# Patient Record
Sex: Female | Born: 1975 | Race: Black or African American | Hispanic: No | Marital: Married | Smoking: Never smoker
Health system: Southern US, Community
[De-identification: ages and names within clinical notes are randomized; demographics above are authoritative.]

## PROBLEM LIST (undated history)

## (undated) DIAGNOSIS — R51 Headache: Secondary | ICD-10-CM

## (undated) DIAGNOSIS — Z789 Other specified health status: Secondary | ICD-10-CM

## (undated) DIAGNOSIS — E669 Obesity, unspecified: Secondary | ICD-10-CM

## (undated) DIAGNOSIS — D573 Sickle-cell trait: Secondary | ICD-10-CM

## (undated) DIAGNOSIS — D352 Benign neoplasm of pituitary gland: Secondary | ICD-10-CM

## (undated) DIAGNOSIS — D496 Neoplasm of unspecified behavior of brain: Secondary | ICD-10-CM

## (undated) DIAGNOSIS — J189 Pneumonia, unspecified organism: Secondary | ICD-10-CM

## (undated) DIAGNOSIS — K219 Gastro-esophageal reflux disease without esophagitis: Secondary | ICD-10-CM

## (undated) HISTORY — DX: Other specified health status: Z78.9

## (undated) HISTORY — DX: Obesity, unspecified: E66.9

## (undated) HISTORY — PX: GALLBLADDER SURGERY: SHX652

## (undated) HISTORY — DX: Benign neoplasm of pituitary gland: D35.2

---

## 1997-04-07 ENCOUNTER — Other Ambulatory Visit: Admission: RE | Admit: 1997-04-07 | Discharge: 1997-04-07 | Payer: Self-pay | Admitting: Obstetrics

## 1997-06-03 ENCOUNTER — Ambulatory Visit (HOSPITAL_COMMUNITY): Admission: RE | Admit: 1997-06-03 | Discharge: 1997-06-03 | Payer: Self-pay | Admitting: Obstetrics

## 1997-07-02 ENCOUNTER — Inpatient Hospital Stay (HOSPITAL_COMMUNITY): Admission: AD | Admit: 1997-07-02 | Discharge: 1997-07-02 | Payer: Self-pay | Admitting: Obstetrics

## 1997-08-26 ENCOUNTER — Ambulatory Visit (HOSPITAL_COMMUNITY)
Admission: RE | Admit: 1997-08-26 | Discharge: 1997-08-26 | Payer: No Typology Code available for payment source | Admitting: Obstetrics

## 1997-10-22 ENCOUNTER — Inpatient Hospital Stay (HOSPITAL_COMMUNITY): Admission: AD | Admit: 1997-10-22 | Discharge: 1997-10-22 | Payer: Self-pay | Admitting: Obstetrics

## 1997-11-06 ENCOUNTER — Inpatient Hospital Stay (HOSPITAL_COMMUNITY): Admission: AD | Admit: 1997-11-06 | Discharge: 1997-11-08 | Payer: Self-pay | Admitting: Obstetrics

## 1999-02-07 ENCOUNTER — Inpatient Hospital Stay (HOSPITAL_COMMUNITY): Admission: AD | Admit: 1999-02-07 | Discharge: 1999-02-07 | Payer: Self-pay | Admitting: *Deleted

## 2001-01-19 ENCOUNTER — Emergency Department (HOSPITAL_COMMUNITY)
Admission: EM | Admit: 2001-01-19 | Discharge: 2001-01-19 | Payer: No Typology Code available for payment source | Admitting: Emergency Medicine

## 2002-07-23 ENCOUNTER — Emergency Department (HOSPITAL_COMMUNITY): Admission: EM | Admit: 2002-07-23 | Discharge: 2002-07-23 | Payer: Self-pay | Admitting: Emergency Medicine

## 2002-07-26 ENCOUNTER — Emergency Department (HOSPITAL_COMMUNITY): Admission: EM | Admit: 2002-07-26 | Discharge: 2002-07-26 | Payer: Self-pay | Admitting: Emergency Medicine

## 2005-01-15 DIAGNOSIS — J189 Pneumonia, unspecified organism: Secondary | ICD-10-CM

## 2005-01-15 HISTORY — DX: Pneumonia, unspecified organism: J18.9

## 2005-09-06 ENCOUNTER — Emergency Department (HOSPITAL_COMMUNITY): Admission: EM | Admit: 2005-09-06 | Discharge: 2005-09-06 | Payer: Self-pay | Admitting: Emergency Medicine

## 2007-05-22 ENCOUNTER — Encounter: Admission: RE | Admit: 2007-05-22 | Discharge: 2007-05-22 | Payer: Self-pay | Admitting: Endocrinology

## 2009-05-12 ENCOUNTER — Inpatient Hospital Stay (HOSPITAL_COMMUNITY): Admission: EM | Admit: 2009-05-12 | Discharge: 2009-05-13 | Payer: Self-pay | Admitting: Neurosurgery

## 2009-05-12 ENCOUNTER — Encounter: Payer: Self-pay | Admitting: Emergency Medicine

## 2009-08-04 HISTORY — PX: OTHER SURGICAL HISTORY: SHX169

## 2010-02-05 ENCOUNTER — Encounter: Payer: Self-pay | Admitting: Endocrinology

## 2010-04-04 LAB — URINE CULTURE

## 2010-04-04 LAB — POCT I-STAT, CHEM 8
Chloride: 104 mEq/L (ref 96–112)
Creatinine, Ser: 0.7 mg/dL (ref 0.4–1.2)
Glucose, Bld: 101 mg/dL — ABNORMAL HIGH (ref 70–99)
HCT: 31 % — ABNORMAL LOW (ref 36.0–46.0)
Hemoglobin: 10.5 g/dL — ABNORMAL LOW (ref 12.0–15.0)
Potassium: 3.5 mEq/L (ref 3.5–5.1)
Sodium: 142 mEq/L (ref 135–145)

## 2010-04-04 LAB — DIFFERENTIAL
Eosinophils Absolute: 0.2 10*3/uL (ref 0.0–0.7)
Lymphs Abs: 1.4 10*3/uL (ref 0.7–4.0)
Monocytes Relative: 7 % (ref 3–12)
Neutro Abs: 5.1 10*3/uL (ref 1.7–7.7)
Neutrophils Relative %: 71 % (ref 43–77)

## 2010-04-04 LAB — CORTISOL: Cortisol, Plasma: 17.1 ug/dL

## 2010-04-04 LAB — URINALYSIS, ROUTINE W REFLEX MICROSCOPIC
Glucose, UA: NEGATIVE mg/dL
Hgb urine dipstick: NEGATIVE
Nitrite: NEGATIVE
Specific Gravity, Urine: 1.013 (ref 1.005–1.030)
Urobilinogen, UA: 1 mg/dL (ref 0.0–1.0)
pH: 8 (ref 5.0–8.0)

## 2010-04-04 LAB — CBC
HCT: 31 % — ABNORMAL LOW (ref 36.0–46.0)
MCV: 73.9 fL — ABNORMAL LOW (ref 78.0–100.0)
Platelets: 304 10*3/uL (ref 150–400)
RBC: 4.19 MIL/uL (ref 3.87–5.11)

## 2010-04-04 LAB — FOLLICLE STIMULATING HORMONE: FSH: 1.4 m[IU]/mL

## 2010-04-04 LAB — URINE MICROSCOPIC-ADD ON

## 2010-04-04 LAB — PROLACTIN: Prolactin: 582.5 ng/mL — ABNORMAL HIGH

## 2010-04-04 LAB — LUTEINIZING HORMONE: LH: 0.3 m[IU]/mL

## 2011-04-04 ENCOUNTER — Encounter (HOSPITAL_COMMUNITY): Payer: Self-pay | Admitting: Emergency Medicine

## 2011-04-04 ENCOUNTER — Emergency Department (HOSPITAL_COMMUNITY): Payer: No Typology Code available for payment source

## 2011-04-04 ENCOUNTER — Emergency Department (HOSPITAL_COMMUNITY)
Admission: EM | Admit: 2011-04-04 | Discharge: 2011-04-04 | Disposition: A | Discharge status: Home / Self Care | Payer: No Typology Code available for payment source | Attending: Emergency Medicine | Admitting: Emergency Medicine

## 2011-04-04 DIAGNOSIS — R071 Chest pain on breathing: Secondary | ICD-10-CM | POA: Insufficient documentation

## 2011-04-04 DIAGNOSIS — D496 Neoplasm of unspecified behavior of brain: Secondary | ICD-10-CM | POA: Insufficient documentation

## 2011-04-04 DIAGNOSIS — S139XXA Sprain of joints and ligaments of unspecified parts of neck, initial encounter: Secondary | ICD-10-CM | POA: Insufficient documentation

## 2011-04-04 DIAGNOSIS — S161XXA Strain of muscle, fascia and tendon at neck level, initial encounter: Secondary | ICD-10-CM

## 2011-04-04 DIAGNOSIS — M549 Dorsalgia, unspecified: Secondary | ICD-10-CM | POA: Insufficient documentation

## 2011-04-04 DIAGNOSIS — R259 Unspecified abnormal involuntary movements: Secondary | ICD-10-CM | POA: Insufficient documentation

## 2011-04-04 HISTORY — DX: Neoplasm of unspecified behavior of brain: D49.6

## 2011-04-04 MED ORDER — KETOROLAC TROMETHAMINE 60 MG/2ML IM SOLN
60.0000 mg | Freq: Once | INTRAMUSCULAR | Status: AC
  Administered 2011-04-04: 60 mg via INTRAMUSCULAR
  Filled 2011-04-04: qty 2

## 2011-04-04 MED ORDER — DIAZEPAM 5 MG PO TABS
5.0000 mg | ORAL_TABLET | Freq: Once | ORAL | Status: AC
  Administered 2011-04-04: 5 mg via ORAL
  Filled 2011-04-04: qty 1

## 2011-04-04 MED ORDER — IBUPROFEN 800 MG PO TABS: 800.0000 mg | ORAL_TABLET | Freq: Three times a day (TID) | ORAL | Status: AC

## 2011-04-04 MED ORDER — DIAZEPAM 5 MG PO TABS: 5.0000 mg | ORAL_TABLET | Freq: Two times a day (BID) | ORAL | Status: AC

## 2011-04-04 NOTE — ED Notes (Signed)
Returned from xray
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
## 2011-04-04 NOTE — Discharge Instructions (Signed)
Take ibuprofen, up to 800mg  three times a day, as needed for pain. Apply heat to spasming muscles.  Avoid activities that aggravate pain.  You may return to the ER if your pain worsens or you have any other concerns, particularly if you develop difficulty breathingCervical Sprain A cervical sprain is an injury in the neck in which the ligaments are stretched or torn. The ligaments are the tissues that hold the bones of the neck (vertebrae) in place.Cervical sprains can range from very mild to very severe. Most cervical sprains get better in 1 to 3 weeks, but it depends on the cause and extent of the injury. Severe cervical sprains can cause the neck vertebrae to be unstable. This can lead to damage of the spinal cord and can result in serious nervous system problems. Your caregiver will determine whether your cervical sprain is mild or severe. CAUSES  Severe cervical sprains may be caused by:  Contact sport injuries (football, rugby, wrestling, hockey, auto racing, gymnastics, diving, martial arts, boxing).   Motor vehicle collisions.   Whiplash injuries. This means the neck is forcefully whipped backward and forward.   Falls.  Mild cervical sprains may be caused by:   Awkward positions, such as cradling a telephone between your ear and shoulder.   Sitting in a chair that does not offer proper support.   Working at a poorly Marketing executive station.   Activities that require looking up or down for long periods of time.  SYMPTOMS   Pain, soreness, stiffness, or a burning sensation in the front, back, or sides of the neck. This discomfort may develop immediately after injury or it may develop slowly and not begin for 24 hours or more after an injury.   Pain or tenderness directly in the middle of the back of the neck.   Shoulder or upper back pain.   Limited ability to move the neck.   Headache.   Dizziness.   Weakness, numbness, or tingling in the hands or arms.   Muscle spasms.    Difficulty swallowing or chewing.   Tenderness and swelling of the neck.  DIAGNOSIS  Most of the time, your caregiver can diagnose this problem by taking your history and doing a physical exam. Your caregiver will ask about any known problems, such as arthritis in the neck or a previous neck injury. X-rays may be taken to find out if there are any other problems, such as problems with the bones of the neck. However, an X-ray often does not reveal the full extent of a cervical sprain. Other tests such as a computed tomography (CT) scan or magnetic resonance imaging (MRI) may be needed. TREATMENT  Treatment depends on the severity of the cervical sprain. Mild sprains can be treated with rest, keeping the neck in place (immobilization), and pain medicines. Severe cervical sprains need immediate immobilization and an appointment with an orthopedist or neurosurgeon. Several treatment options are available to help with pain, muscle spasms, and other symptoms. Your caregiver may prescribe:  Medicines, such as pain relievers, numbing medicines, or muscle relaxants.   Physical therapy. This can include stretching exercises, strengthening exercises, and posture training. Exercises and improved posture can help stabilize the neck, strengthen muscles, and help stop symptoms from returning.   A neck collar to be worn for short periods of time. Often, these collars are worn for comfort. However, certain collars may be worn to protect the neck and prevent further worsening of a serious cervical sprain.  HOME CARE INSTRUCTIONS  Put ice on the injured area.   Put ice in a plastic bag.   Place a towel between your skin and the bag.   Leave the ice on for 15 to 20 minutes, 3 to 4 times a day.   Only take over-the-counter or prescription medicines for pain, discomfort, or fever as directed by your caregiver.   Keep all follow-up appointments as directed by your caregiver.   Keep all physical therapy  appointments as directed by your caregiver.   If a neck collar is prescribed, wear it as directed by your caregiver.   Do not drive while wearing a neck collar.   Make any needed adjustments to your work station to promote good posture.   Avoid positions and activities that make your symptoms worse.   Warm up and stretch before being active to help prevent problems.  SEEK MEDICAL CARE IF:   Your pain is not controlled with medicine.   You are unable to decrease your pain medicine over time as planned.   Your activity level is not improving as expected.  SEEK IMMEDIATE MEDICAL CARE IF:   You develop any bleeding, stomach upset, or signs of an allergic reaction to your medicine.   Your symptoms get worse.   You develop new, unexplained symptoms.   You have numbness, tingling, weakness, or paralysis in any part of your body.  MAKE SURE YOU:   Understand these instructions.   Will watch your condition.   Will get help right away if you are not doing well or get worse.  Document Released: 10/29/2006 Document Revised: 12/21/2010 Document Reviewed: 10/04/2010 [facility] Patient Information 2012 [place], [place].Marland Kitchen
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
## 2011-04-04 NOTE — ED Notes (Signed)
Patient transported to X-ray 

## 2012-03-14 ENCOUNTER — Encounter: Payer: Self-pay | Admitting: Nurse Practitioner

## 2012-04-10 ENCOUNTER — Ambulatory Visit (INDEPENDENT_AMBULATORY_CARE_PROVIDER_SITE_OTHER): Payer: Medicaid Other | Admitting: Nurse Practitioner

## 2012-04-10 ENCOUNTER — Encounter: Payer: Self-pay | Admitting: Nurse Practitioner

## 2012-04-10 VITALS — BP 120/75 | HR 73 | Ht 67.0 in | Wt 184.0 lb

## 2012-04-10 DIAGNOSIS — D352 Benign neoplasm of pituitary gland: Secondary | ICD-10-CM

## 2012-04-10 DIAGNOSIS — D353 Benign neoplasm of craniopharyngeal duct: Secondary | ICD-10-CM

## 2012-04-10 MED ORDER — CABERGOLINE 0.5 MG PO TABS: 1.0000 mg | ORAL_TABLET | ORAL | Status: DC

## 2012-04-10 NOTE — Patient Instructions (Addendum)
Patient will get prolactin level done today and I will call the results If she does not hear from me in several days call the office Continue Cabergoline as directed, prescription called to pharmacy Call for any increased headache or visual changes  Will repeat MRI of the brain and pituitary in 2015, next visit Followup in 6 months

## 2012-04-10 NOTE — Progress Notes (Signed)
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 Her last prolactin level in July 2013 was elevated at 119.5 that had previously been 238.3. Patient is on Cabergoline 0.5 mg 2 tablets twice weekly. She denies any headache or visual field change at since last seen. She had prior surgical resection of the macroadenoma. Last MRI of the brain showed decrease in mass size of the pituitary 2/7/ 2013. 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  ROS:  Negative  Physical Exam General: well developed, well nourished, seated, in no evident distress Head: head normocephalic and atraumatic. Oropharynx benign Neck: supple with no carotid or supraclavicular bruits Cardiovascular: regular rate and rhythm, no murmurs  Neurologic Exam Mental Status: Awake and fully alert. Oriented to place and time. Recent and remote memory intact. Attention span, concentration and fund of knowledge appropriate. Mood and affect appropriate.  Cranial Nerves: Fundoscopic exam reveals flat disc margins. Pupils equal, briskly reactive to light. Extraocular movements full without nystagmus. Visual fields full to confrontation. Hearing intact and symmetric to finger snap. Facial sensation intact. Face, tongue, palate move normally and symmetrically. Neck flexion and extension normal.  Motor: Normal bulk and tone. Normal strength in all tested extremity muscles. Sensory.: intact to touch and pinprick and vibratory.  Coordination: Rapid alternating movements normal in all extremities. Finger-to-nose and heel-to-shin performed accurately bilaterally. Gait and Station: Arises from chair without difficulty. Stance is normal. Gait demonstrates normal stride length and balance . Able to heel, toe and tandem walk without difficulty.  Reflexes: 1+ and symmetric. Toes downgoing.     ASSESSMENT: Pituitary macroadenoma, prolactin secreting.     PLAN: Patient to continue  Cabergoline 0.5 mg 2 tabs twice weekly. Prescription renewed  Will repeat MRI of the brain  and pituitary February 2015.  Will get prolactin level drawn today.  Nilda Riggs, GNP-BC APRN

## 2012-04-10 NOTE — Progress Notes (Signed)
I have reviewed the note, and I agree with the medical plan. Marlan Palau MD 04/10/2012 5:35 PM  [facility] [address] [place], [place] [address]  Phone [phone] Fax [phone]

## 2012-04-11 LAB — PROLACTIN: Prolactin: 120.6 ng/mL — ABNORMAL HIGH (ref 4.8–23.3)

## 2012-04-14 ENCOUNTER — Telehealth: Payer: Self-pay | Admitting: *Deleted

## 2012-04-14 NOTE — Telephone Encounter (Signed)
"  Please return my call." 

## 2012-04-17 NOTE — Telephone Encounter (Signed)
Called patient and she wanted to know what her levels were from her labs. Patient wants results mailed to her from this time and last time so she can compare.

## 2012-04-17 NOTE — Telephone Encounter (Signed)
Mailing labs to patient. Patient wants to know if she could start taking vitamins. The vitiamin she wants to take is biotin and she wants to know if she could take them along with her medicines she is already on. Told patient taking a vitamin is fine but she wants to make sure form CM.

## 2012-04-17 NOTE — Telephone Encounter (Signed)
Patient aware.

## 2012-04-17 NOTE — Telephone Encounter (Signed)
OK to take Biotin.

## 2012-06-23 ENCOUNTER — Telehealth: Payer: Self-pay | Admitting: Neurology

## 2012-07-02 NOTE — Telephone Encounter (Signed)
I discussed with Dr. Anne Hahn, this medication is not classified as a steroid. She will need to stay on this as long as her prolactin levels are elevated. It is the only drug indicated for this.

## 2012-07-02 NOTE — Telephone Encounter (Signed)
Pt to have surgery (breast reduction) and doctor wants her to lose weight and she is on this medication cabergoline 0.5mg  which causes her to gain weight.  Can she come off?   I LMVM for pt to return call if anything more.  Original message not routed to triage.

## 2012-07-02 NOTE — Telephone Encounter (Signed)
I called pt and let her know via VM of below message about cabergoline.  She is to call back if questions.

## 2012-07-03 NOTE — Telephone Encounter (Signed)
I spoke to pt this am and  I relayed to her that the medication cabergoline is not a steroid, repeated as per below.  As per Enid Skeens, NP this does not cause weight gain.  Pt would like copy sent to Dr. Shon Hough (ofv note and note re: this medication) and also one for herself.  She will p/u today and sign release.

## 2012-10-10 ENCOUNTER — Encounter: Payer: Self-pay | Admitting: Nurse Practitioner

## 2012-10-10 ENCOUNTER — Ambulatory Visit (INDEPENDENT_AMBULATORY_CARE_PROVIDER_SITE_OTHER): Payer: Medicaid Other | Admitting: Nurse Practitioner

## 2012-10-10 VITALS — BP 113/77 | HR 85 | Ht 67.0 in | Wt 180.0 lb

## 2012-10-10 DIAGNOSIS — D352 Benign neoplasm of pituitary gland: Secondary | ICD-10-CM

## 2012-10-10 MED ORDER — CABERGOLINE 0.5 MG PO TABS: 1.0000 mg | ORAL_TABLET | ORAL | Status: DC

## 2012-10-10 NOTE — Progress Notes (Signed)
[facility]  PATIENT: Kari Coffey DOB: 24-Mar-1975   REASON FOR VISIT: Followup for pituitary macroadenoma   HISTORY OF PRESENT ILLNESS: Kari Coffey, 37 -year-old black female returns for followup. She has a history  of pituitary adenoma that is prolactin secreting. Her last prolactin level in 04/10/12  was elevated at 120.6.,  previously been 238.3. Patient is on Cabergoline 0.5 mg 2 tablets twice weekly. She denies any headache or visual field changes  since last seen. She had prior surgical resection of the macroadenoma. Last MRI of the brain showed decrease in mass size of the pituitary 2/7/ 2013. 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 She is exercising and eating healthy and states she feels better than  she has a long time    REVIEW OF SYSTEMS: Full 14 system review of systems performed and notable only for:  Constitutional: N/A  Cardiovascular: N/A  Ear/Nose/Throat: N/A  Skin: N/A  Eyes: N/A  Respiratory: N/A  Gastroitestinal: N/A  Hematology/Lymphatic: N/A  Endocrine: N/A Musculoskeletal:N/A  Allergy/Immunology: N/A  Neurological: N/A Psychiatric: N/A   ALLERGIES: No Known Allergies  HOME MEDICATIONS: Outpatient Prescriptions Prior to Visit  Medication Sig Dispense Refill  . cabergoline (DOSTINEX) 0.5 MG tablet Take 2 tablets (1 mg total) by mouth 2 (two) times a week. Mondays and Thursdays  16 tablet  6   No facility-administered medications prior to visit.    PAST MEDICAL HISTORY: Past Medical History  Diagnosis Date  . Brain tumor   . Pituitary macroadenoma   . Obesity     PAST SURGICAL HISTORY: History reviewed. No pertinent past surgical history.  FAMILY HISTORY: Family History  Problem Relation Age of Onset  . Healthy Father   . Healthy Sister   . Healthy Brother   . Healthy Sister   . Healthy Brother   . Healthy Brother   . Healthy Brother     SOCIAL HISTORY: History   Social History  .  Marital Status: Single    Spouse Name: N/A    Number of Children: N/A  . Years of Education: College    Occupational History  . Not on file.   Social History Main Topics  . Smoking status: Never Smoker   . Smokeless tobacco: Never Used  . Alcohol Use: No  . Drug Use: No  . Sexual Activity: Not on file   Other Topics Concern  . Not on file   Social History Narrative  . No narrative on file     PHYSICAL EXAM  Filed Vitals:   10/10/12 0929  BP: 113/77  Pulse: 85  Height: 5\' 7"  (1.702 m)  Weight: 180 lb (81.647 kg)   Body mass index is 28.19 kg/(m^2).  Generalized: Well developed, in no acute distress  Head: normocephalic and atraumatic,. Oropharynx benign  Neck: Supple, no carotid bruits  Cardiac: Regular rate rhythm, no murmur  Musculoskeletal: No deformity   Neurological examination   Mentation: Alert oriented to time, place, history taking. Follows all commands speech and language fluent  Cranial nerve II-XII: Fundoscopic exam reveals flat disc margins.visual acuity 20/40 right, 20/100 left. Pupils were equal round reactive to light extraocular movements were full, visual field were full on confrontational test. Facial sensation and strength were normal. hearing was intact to finger rubbing bilaterally. Uvula tongue midline. head turning and shoulder shrug and were normal and symmetric.Tongue protrusion into cheek strength was normal. Motor: normal bulk and tone, full strength in the BUE, BLE, fine finger movements normal,  no pronator drift. No focal weakness Sensory: normal and symmetric to light touch, pinprick, and  vibration  Coordination: finger-nose-finger, heel-to-shin bilaterally, no dysmetria Reflexes: 1+ and symmetric upper and lower Gait and Station: Rising up from seated position without assistance, normal stance,  moderate stride, good arm swing, smooth turning, able to perform tiptoe, and heel walking without difficulty.   DIAGNOSTIC DATA (LABS,  IMAGING, TESTING) -Last Prolactin level was 120.6    ASSESSMENT AND PLAN  37 y.o. year old female  has a past medical history of Brain tumor; Pituitary macroadenoma; and Obesity here for followup. She denies any headaches or visual symptoms  Will get a prolactin level today Will repeat MRI of the brain and pituitary in 2015, February please call and schedule prior to your appointment in March Continue Cabergoline at current dose will renew.  Followup in 6 months  Nilda Riggs, [facility], [facility], APRN  [facility] [address] [place], [place] [address] [phone]

## 2012-10-10 NOTE — Patient Instructions (Addendum)
Will get a prolactin level today Will repeat MRI of the brain and pituitary in 2015, February please call and schedule prior to your appointment in March Continue Cabergoline at current dose.  Followup in 6 months

## 2012-10-11 LAB — PROLACTIN: Prolactin: 87.6 ng/mL — ABNORMAL HIGH (ref 4.8–23.3)

## 2012-10-15 NOTE — Progress Notes (Signed)
Quick Note:  I called pt and relayed the results of prolactin level to her. She verbalized understanding. Released to mychart. ______

## 2012-11-19 ENCOUNTER — Other Ambulatory Visit (HOSPITAL_COMMUNITY): Payer: Self-pay | Admitting: Obstetrics

## 2012-11-19 DIAGNOSIS — Z1231 Encounter for screening mammogram for malignant neoplasm of breast: Secondary | ICD-10-CM

## 2012-11-20 ENCOUNTER — Ambulatory Visit (HOSPITAL_COMMUNITY)
Admission: RE | Admit: 2012-11-20 | Discharge: 2012-11-20 | Disposition: A | Discharge status: Home / Self Care | Payer: Medicaid Other | Source: Ambulatory Visit | Attending: Obstetrics | Admitting: Obstetrics

## 2012-11-20 ENCOUNTER — Other Ambulatory Visit: Payer: Self-pay

## 2012-11-20 DIAGNOSIS — Z1231 Encounter for screening mammogram for malignant neoplasm of breast: Secondary | ICD-10-CM | POA: Insufficient documentation

## 2012-12-04 ENCOUNTER — Telehealth: Payer: Self-pay | Admitting: Neurology

## 2012-12-04 MED ORDER — CABERGOLINE 0.5 MG PO TABS: 1.0000 mg | ORAL_TABLET | ORAL | Status: DC

## 2012-12-04 NOTE — Telephone Encounter (Signed)
Pharmacy indicates Rx must be sent under MD, not NP.  Rx was resent under MD.

## 2013-02-09 ENCOUNTER — Encounter: Payer: Self-pay | Admitting: Nurse Practitioner

## 2013-02-09 ENCOUNTER — Ambulatory Visit (INDEPENDENT_AMBULATORY_CARE_PROVIDER_SITE_OTHER): Payer: Medicaid Other | Admitting: Nurse Practitioner

## 2013-02-09 VITALS — BP 125/84 | HR 75 | Ht 66.0 in | Wt 182.0 lb

## 2013-02-09 DIAGNOSIS — Z79899 Other long term (current) drug therapy: Secondary | ICD-10-CM

## 2013-02-09 DIAGNOSIS — D352 Benign neoplasm of pituitary gland: Secondary | ICD-10-CM

## 2013-02-09 DIAGNOSIS — D353 Benign neoplasm of craniopharyngeal duct: Secondary | ICD-10-CM

## 2013-02-09 NOTE — Patient Instructions (Signed)
Will check prolactin level today Continue Cabergoline as ordered MRI of the brain with and without contrast ordered Followup in 6 months

## 2013-02-09 NOTE — Progress Notes (Signed)
[facility]  PATIENT: Kari Coffey DOB: December 28, 1975   REASON FOR VISIT: follow up pitutary macroadenoma    HISTORY OF PRESENT ILLNESS:[name], 39 year old black female returns for follow up. 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 Her last prolactin level 10/10/12 was 87.6. She remains on Cabergoline twice weekly. Last MRI of the brain was February 2013, she is due for repeat. 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 Her last prolactin level in 04/10/12 was elevated at 120.6., previously been 238.3. Patient is on Cabergoline 0.5 mg 2 tablets twice weekly. She denies any headache or visual field changes since last seen. She had prior surgical resection of the macroadenoma. Last MRI of the brain showed decrease in mass size of the pituitary 2/7/ 2013. 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 She is exercising and eating healthy and states she feels better than she has a long time      REVIEW OF SYSTEMS: Full 14 system review of systems performed and notable only for those listed, all others are neg:  Constitutional: N/A  Cardiovascular: N/A  Ear/Nose/Throat: N/A  Skin: N/A  Eyes: N/A  Respiratory: N/A  Gastroitestinal: N/A  Hematology/Lymphatic: N/A  Endocrine: N/A Musculoskeletal:N/A  Allergy/Immunology: N/A  Neurological: N/A Psychiatric: N/A   ALLERGIES: No Known Allergies  HOME MEDICATIONS: Outpatient Prescriptions Prior to Visit  Medication Sig Dispense Refill  . cabergoline (DOSTINEX) 0.5 MG tablet Take 2 tablets (1 mg total) by mouth 2 (two) times a week. Mondays and Thursdays  16 tablet  6   No facility-administered medications prior to visit.    PAST MEDICAL HISTORY: Past Medical History  Diagnosis Date  . Brain tumor   . Pituitary macroadenoma   . Obesity     PAST SURGICAL HISTORY: History reviewed. No pertinent  past surgical history.  FAMILY HISTORY: Family History  Problem Relation Age of Onset  . Healthy Father   . Healthy Sister   . Healthy Brother   . Healthy Sister   . Healthy Brother   . Healthy Brother   . Healthy Brother     SOCIAL HISTORY: History   Social History  . Marital Status: Single    Spouse Name: N/A    Number of Children: 1  . Years of Education: 12+   Occupational History  . Not on file.   Social History Main Topics  . Smoking status: Never Smoker   . Smokeless tobacco: Never Used  . Alcohol Use: No  . Drug Use: No  . Sexual Activity: Not on file   Other Topics Concern  . Not on file   Social History Narrative   Patient lives at home with son.   Patient has one child.   Patient is right handed.   Patient has her BA.    Patient is single.     PHYSICAL EXAM  Filed Vitals:   02/09/13 1333  BP: 125/84  Pulse: 75  Height: 5\' 6"  (1.676 m)  Weight: 182 lb (82.555 kg)   Body mass index is 29.39 kg/(m^2).  Generalized: Well developed, in no acute distress  Head: normocephalic and atraumatic,. Oropharynx benign  Neck: Supple, no carotid bruits  Cardiac: Regular rate rhythm, no murmur  Musculoskeletal: No deformity   Neurological examination   Mentation: Alert oriented to time, place, history taking. Follows all commands speech and language fluent  Cranial nerve  II-XII: Fundoscopic exam reveals sharp disc margins.Visual acuity 20/20 bilaterally. Pupils were equal round reactive to light extraocular movements were full, visual field were full on confrontational test. Facial sensation and strength were normal. hearing was intact to finger rubbing bilaterally. Uvula tongue midline. head turning and shoulder shrug were normal and symmetric.Tongue protrusion into cheek strength was normal. Motor: normal bulk and tone, full strength in the BUE, BLE, fine finger movements normal, no pronator drift. No focal weakness Sensory: normal and symmetric to light  touch, pinprick, and  vibration  Coordination: finger-nose-finger, heel-to-shin bilaterally, no dysmetria Reflexes: 1+ upper lower and symmetric  Gait and Station: Rising up from seated position without assistance, normal stance,  moderate stride, good arm swing, smooth turning, able to perform tiptoe, and heel walking without difficulty. Tandem gait is steady  DIAGNOSTIC DATA (LABS, IMAGING, TESTING) - None to review  ASSESSMENT AND PLAN  38 y.o. year old female  has a past medical history of Brain tumor; Pituitary macroadenoma; and Obesity. here to follow up  Will check prolactin level today Continue Cabergoline as ordered MRI of the brain with and without contrast ordered Followup in 6 months Dennie Bible, [facility], [facility], [facility] [address], [place], [place] [address] [phone]

## 2013-02-09 NOTE — Progress Notes (Signed)
I have read the note, and I agree with the clinical assessment and plan.  Niharika Savino KEITH   

## 2013-02-10 LAB — BASIC METABOLIC PANEL
BUN / CREAT RATIO: 11 (ref 8–20)
BUN: 8 mg/dL (ref 6–20)
CALCIUM: 9.5 mg/dL (ref 8.7–10.2)
CO2: 25 mmol/L (ref 18–29)
CREATININE: 0.76 mg/dL (ref 0.57–1.00)
Chloride: 102 mmol/L (ref 97–108)
GFR, EST AFRICAN AMERICAN: 118 mL/min/{1.73_m2} (ref >59)
GFR, EST NON AFRICAN AMERICAN: 102 mL/min/{1.73_m2} (ref >59)
Glucose: 82 mg/dL (ref 65–99)
POTASSIUM: 4 mmol/L (ref 3.5–5.2)
SODIUM: 144 mmol/L (ref 134–144)

## 2013-02-10 LAB — PROLACTIN: PROLACTIN: 103.7 ng/mL — AB (ref 4.8–23.3)

## 2013-02-11 NOTE — Progress Notes (Signed)
Quick Note:  Left message that prolactin level is elevated and to continue medication, per Hoyle Sauer ______

## 2013-04-01 ENCOUNTER — Ambulatory Visit
Admission: RE | Admit: 2013-04-01 | Discharge: 2013-04-01 | Disposition: A | Discharge status: Home / Self Care | Payer: Medicaid Other | Source: Ambulatory Visit | Attending: Nurse Practitioner | Admitting: Nurse Practitioner

## 2013-04-01 DIAGNOSIS — D352 Benign neoplasm of pituitary gland: Secondary | ICD-10-CM

## 2013-04-01 MED ORDER — GADOBENATE DIMEGLUMINE 529 MG/ML IV SOLN
8.0000 mL | Freq: Once | INTRAVENOUS | Status: AC | PRN
  Administered 2013-04-01: 8 mL via INTRAVENOUS

## 2013-04-14 ENCOUNTER — Ambulatory Visit: Payer: Medicaid Other | Admitting: Nurse Practitioner

## 2013-04-20 ENCOUNTER — Ambulatory Visit: Payer: Medicaid Other | Admitting: Nurse Practitioner

## 2013-08-04 ENCOUNTER — Encounter (HOSPITAL_COMMUNITY): Payer: Self-pay | Admitting: Emergency Medicine

## 2013-08-04 ENCOUNTER — Emergency Department (HOSPITAL_COMMUNITY)
Admission: EM | Admit: 2013-08-04 | Discharge: 2013-08-04 | Disposition: A | Discharge status: Other Acute Inpatient Hospital | Payer: Medicaid Other | Source: Home / Self Care | Attending: Family Medicine | Admitting: Family Medicine

## 2013-08-04 ENCOUNTER — Emergency Department (HOSPITAL_COMMUNITY)
Admission: EM | Admit: 2013-08-04 | Discharge: 2013-08-04 | Disposition: A | Discharge status: Home / Self Care | Payer: Medicaid Other | Attending: Emergency Medicine | Admitting: Emergency Medicine

## 2013-08-04 ENCOUNTER — Emergency Department (HOSPITAL_COMMUNITY): Payer: Medicaid Other

## 2013-08-04 DIAGNOSIS — Z86011 Personal history of benign neoplasm of the brain: Secondary | ICD-10-CM | POA: Diagnosis not present

## 2013-08-04 DIAGNOSIS — Z862 Personal history of diseases of the blood and blood-forming organs and certain disorders involving the immune mechanism: Secondary | ICD-10-CM | POA: Diagnosis not present

## 2013-08-04 DIAGNOSIS — R52 Pain, unspecified: Secondary | ICD-10-CM

## 2013-08-04 DIAGNOSIS — R112 Nausea with vomiting, unspecified: Secondary | ICD-10-CM | POA: Insufficient documentation

## 2013-08-04 DIAGNOSIS — K8010 Calculus of gallbladder with chronic cholecystitis without obstruction: Secondary | ICD-10-CM | POA: Diagnosis not present

## 2013-08-04 DIAGNOSIS — R1013 Epigastric pain: Secondary | ICD-10-CM | POA: Diagnosis present

## 2013-08-04 DIAGNOSIS — E669 Obesity, unspecified: Secondary | ICD-10-CM | POA: Diagnosis not present

## 2013-08-04 DIAGNOSIS — Z8639 Personal history of other endocrine, nutritional and metabolic disease: Secondary | ICD-10-CM | POA: Insufficient documentation

## 2013-08-04 LAB — CBC WITH DIFFERENTIAL/PLATELET
BASOS PCT: 0 % (ref 0–1)
Basophils Absolute: 0 10*3/uL (ref 0.0–0.1)
EOS ABS: 0.1 10*3/uL (ref 0.0–0.7)
Eosinophils Relative: 1 % (ref 0–5)
HEMATOCRIT: 38.1 % (ref 36.0–46.0)
HEMOGLOBIN: 12.5 g/dL (ref 12.0–15.0)
LYMPHS ABS: 0.9 10*3/uL (ref 0.7–4.0)
LYMPHS PCT: 17 % (ref 12–46)
MCH: 24.2 pg — ABNORMAL LOW (ref 26.0–34.0)
MCHC: 32.8 g/dL (ref 30.0–36.0)
MCV: 73.8 fL — AB (ref 78.0–100.0)
MONOS PCT: 5 % (ref 3–12)
Monocytes Absolute: 0.3 10*3/uL (ref 0.1–1.0)
NEUTROS ABS: 4 10*3/uL (ref 1.7–7.7)
Neutrophils Relative %: 77 % (ref 43–77)
Platelets: 308 10*3/uL (ref 150–400)
RBC: 5.16 MIL/uL — ABNORMAL HIGH (ref 3.87–5.11)
RDW: 13.8 % (ref 11.5–15.5)
WBC: 5.3 10*3/uL (ref 4.0–10.5)

## 2013-08-04 LAB — COMPREHENSIVE METABOLIC PANEL
ALK PHOS: 102 U/L (ref 39–117)
ALT: 21 U/L (ref 0–35)
ANION GAP: 10 (ref 5–15)
AST: 65 U/L — ABNORMAL HIGH (ref 0–37)
Albumin: 3.7 g/dL (ref 3.5–5.2)
BUN: 9 mg/dL (ref 6–23)
CHLORIDE: 106 meq/L (ref 96–112)
CO2: 25 mEq/L (ref 19–32)
CREATININE: 0.68 mg/dL (ref 0.50–1.10)
Calcium: 8.9 mg/dL (ref 8.4–10.5)
GFR calc non Af Amer: >90 mL/min (ref >90)
GLUCOSE: 92 mg/dL (ref 70–99)
POTASSIUM: 4 meq/L (ref 3.7–5.3)
Sodium: 141 mEq/L (ref 137–147)
Total Protein: 7.3 g/dL (ref 6.0–8.3)

## 2013-08-04 LAB — LIPASE, BLOOD: LIPASE: 32 U/L (ref 11–59)

## 2013-08-04 LAB — I-STAT TROPONIN, ED: Troponin i, poc: 0 ng/mL (ref 0.00–0.08)

## 2013-08-04 MED ORDER — HYDROCODONE-ACETAMINOPHEN 5-325 MG PO TABS: 1.0000 | ORAL_TABLET | Freq: Four times a day (QID) | ORAL | Status: DC | PRN

## 2013-08-04 MED ORDER — ONDANSETRON 4 MG PO TBDP: 4.0000 mg | ORAL_TABLET | Freq: Three times a day (TID) | ORAL | Status: DC | PRN

## 2013-08-04 MED ORDER — ONDANSETRON 4 MG PO TBDP
ORAL_TABLET | ORAL | Status: AC
  Filled 2013-08-04: qty 1

## 2013-08-04 MED ORDER — ONDANSETRON HCL 4 MG/2ML IJ SOLN
INTRAMUSCULAR | Status: AC
  Filled 2013-08-04: qty 2

## 2013-08-04 MED ORDER — HYDROMORPHONE HCL PF 1 MG/ML IJ SOLN
INTRAMUSCULAR | Status: AC
  Filled 2013-08-04: qty 1

## 2013-08-04 MED ORDER — ONDANSETRON 4 MG PO TBDP
4.0000 mg | ORAL_TABLET | Freq: Once | ORAL | Status: AC
  Administered 2013-08-04: 4 mg via ORAL

## 2013-08-04 MED ORDER — ONDANSETRON HCL 4 MG/2ML IJ SOLN
4.0000 mg | Freq: Once | INTRAMUSCULAR | Status: AC
  Administered 2013-08-04: 4 mg via INTRAVENOUS

## 2013-08-04 MED ORDER — HYDROMORPHONE HCL 1 MG/ML IJ SOLN
1.0000 mg | Freq: Once | INTRAMUSCULAR | Status: AC
  Administered 2013-08-04: 1 mg via INTRAVENOUS

## 2013-08-04 MED ORDER — SODIUM CHLORIDE 0.9 % IV SOLN
Freq: Once | INTRAVENOUS | Status: AC
  Administered 2013-08-04: 12:00:00 via INTRAVENOUS

## 2013-08-04 NOTE — ED Notes (Signed)
Pt is getting dressed and waiting for discharge paperwork at bedside.

## 2013-08-04 NOTE — ED Notes (Signed)
PT  REPORTS  EPIGASTRIC  PAIN     ONSET    JUST  STARTED       VOMITING        -  DENYS  ANY  DISCHARGE  OR  BLEEDING

## 2013-08-04 NOTE — ED Notes (Signed)
Pt continues to be monitored by blood pressure, and pulse ox, and 5 lead.

## 2013-08-04 NOTE — ED Notes (Signed)
Iv  Ns  20  Angio  l  Hand  1  Att

## 2013-08-04 NOTE — Discharge Instructions (Signed)
Cholecystitis  Cholecystitis is swelling and irritation (inflammation) of your gallbladder. This often happens when gallstones or sludge build up in the gallbladder. Treatment is needed right away. [place] care depends on how you were treated. In general:  If you were given antibiotic medicine, take it as told. Finish the medicine even if you start to feel better.  Only take medicines as told by your doctor.  Eat low-fat foods until your next doctor visit.  Keep all doctor visits as told. GET HELP RIGHT AWAY IF:  You have more pain and medicine does not help.  Your pain moves to a different part of your belly (abdomen) or to your back.  You have a fever.  You feel sick to your stomach (nauseous).  You throw up (vomit). MAKE SURE YOU:  Understand these instructions.  Will watch your condition.  Will get help right away if you are not doing well or get worse. Document Released: 12/21/2010 Document Revised: 03/26/2011 Document Reviewed: 12/21/2010 [facility] Patient Information 2015 [place], [place]. This information is not intended to replace advice given to you by your health care provider. Make sure you discuss any questions you have with your health care provider.  Cholelithiasis Cholelithiasis (also called gallstones) is a form of gallbladder disease. The gallbladder is a small organ that helps you digest fats. Symptoms of gallstones are:  Feeling sick to your stomach (nausea).  Throwing up (vomiting).  Belly pain.  Yellowing of the skin (jaundice).  Sudden pain. You may feel the pain for minutes to hours.  Fever.  Pain to the touch. HOME CARE  Only take medicines as told by your doctor.  Eat a low-fat diet until you see your doctor again. Eating fat can result in pain.  Follow up with your doctor as told. Attacks usually happen time after time. Surgery is usually needed for permanent treatment. GET HELP RIGHT AWAY IF:   Your pain gets worse.  Your  pain is not helped by medicines.  You have a fever and lasting symptoms for more than 2-3 days.  You have a fever and your symptoms suddenly get worse.  You keep feeling sick to your stomach and throwing up. MAKE SURE YOU:   Understand these instructions.  Will watch your condition.  Will get help right away if you are not doing well or get worse. Document Released: 06/20/2007 Document Revised: 09/03/2012 Document Reviewed: 06/25/2012 [facility] Patient Information 2015 [place], [place]. This information is not intended to replace advice given to you by your health care provider. Make sure you discuss any questions you have with your health care provider. As discussed, your ultrasound, shows that you have a gallbladder full of stains with chronic wall thickening.  You have been given a referral to [facility] [place] [facility] to make an appointment for further evaluation and elective surgery in the very near future.  He also been given prescriptions for Zofran to help control any episodes of nausea.  He may have and Vicodin for any pain in his experience.  Please try to follow a bland diet and eat small meals

## 2013-08-04 NOTE — ED Notes (Signed)
Per EMS: Pt transfer from [facility] for mid epigastric abdominal pain that began this morning while at work. Pt reports nausea associated with pain as well, pt had one episode of emesis while at [facility].  Pt given 4 mg of zofran and 1 mg of dilaudid. Pt denies any hx of gallbladder problems in the past. nad noted. Pt states 5/10 pain currently.

## 2013-08-04 NOTE — ED Provider Notes (Signed)
CSN: [id number]     Arrival date & time 08/04/13  1101 History   First MD Initiated Contact with Patient 08/04/13 1109     Chief Complaint  Patient presents with  . Abdominal Pain   (Consider location/radiation/quality/duration/timing/severity/associated sxs/prior Treatment) Patient is a 38 y.o. female presenting with abdominal pain. The history is provided by the patient.  Abdominal Pain Pain location:  Epigastric Pain quality: sharp   Pain severity:  Moderate Onset quality:  Sudden Duration:  3 hours Timing:  Constant Progression:  Worsening Chronicity:  New Context comment:  Onset this am after going to work sx developed, felt weak, came to [facility], 1 episode of vomiting here, no diarrhea, nl bm this am. Relieved by:  None tried Worsened by:  Nothing tried Ineffective treatments:  None tried Associated symptoms: nausea and vomiting   Associated symptoms: no diarrhea, no dysuria, no fever, no hematemesis and no hematuria     Past Medical History  Diagnosis Date  . Brain tumor   . Pituitary macroadenoma   . Obesity    History reviewed. No pertinent past surgical history. Family History  Problem Relation Age of Onset  . Healthy Father   . Healthy Sister   . Healthy Brother   . Healthy Sister   . Healthy Brother   . Healthy Brother   . Healthy Brother    History  Substance Use Topics  . Smoking status: Never Smoker   . Smokeless tobacco: Never Used  . Alcohol Use: No   OB History   Grav Para Term Preterm Abortions TAB SAB Ect Mult Living                 Review of Systems  Constitutional: Negative.  Negative for fever.  HENT: Negative.   Gastrointestinal: Positive for nausea, vomiting and abdominal pain. Negative for diarrhea, blood in stool and hematemesis.  Genitourinary: Negative for dysuria and hematuria.    Allergies  Peanut-containing drug products  Home Medications   Prior to Admission medications   Medication Sig Start Date End Date Taking?  Authorizing Provider  cabergoline (DOSTINEX) 0.5 MG tablet Take 2 tablets (1 mg total) by mouth 2 (two) times a week. Mondays and Thursdays 12/04/12   Kathrynn Ducking, MD   BP 139/97  Pulse 98  Temp(Src) 98.6 F (37 C) (Oral)  Resp 16  SpO2 98% Physical Exam  Nursing note and vitals reviewed. Constitutional: She is oriented to person, place, and time. She appears well-developed and well-nourished.  Neck: Normal range of motion. Neck supple.  Abdominal: Soft. Normal appearance and bowel sounds are normal. She exhibits no distension and no mass. There is tenderness in the epigastric area. There is no rigidity, no rebound, no guarding, no CVA tenderness, no tenderness at McBurney's point and negative Murphy's sign.  Lymphadenopathy:    She has no cervical adenopathy.  Neurological: She is alert and oriented to person, place, and time.  Skin: Skin is dry.    ED Course  Procedures (including critical care time) Labs Review Labs Reviewed - No data to display  Imaging Review No results found.   MDM   1. Abdominal pain, acute, epigastric    Sent for acute onset epigastric pain , vomiting x1 , nl bm this am. Radiates to back.     Billy Fischer, MD 08/04/13 [phone]

## 2013-08-04 NOTE — ED Provider Notes (Signed)
CSN: [id number]     Arrival date & time 08/04/13  1259 History   First MD Initiated Contact with Patient 08/04/13 1302     Chief Complaint  Patient presents with  . Abdominal Pain  . Nausea     (Consider location/radiation/quality/duration/timing/severity/associated sxs/prior Treatment) HPI Comments: Patient presents to the emergency department in transfer from urgent care after developing epigastric pain and vomiting.  While eating peanut butter nabs.  She said she became acutely uncomfortable and diaphoretic.  Denies shortness of breath, previous history of same, denies any cardiac or gallbladder disease, is but does have a strong family history of gallbladder problems, as her mother, and sister.  Both have had their gallbladders removed.  She denies a recent pregnancy.  Denies any previous history of similar type pains, urgent care, gave her Zofran, and Dilaudid with significant reduction in her pain  Patient is a 38 y.o. female presenting with abdominal pain.  Abdominal Pain Associated symptoms: chest pain, nausea and vomiting   Associated symptoms: no constipation, no diarrhea, no fever and no shortness of breath     Past Medical History  Diagnosis Date  . Brain tumor   . Pituitary macroadenoma   . Obesity    History reviewed. No pertinent past surgical history. Family History  Problem Relation Age of Onset  . Healthy Father   . Healthy Sister   . Healthy Brother   . Healthy Sister   . Healthy Brother   . Healthy Brother   . Healthy Brother    History  Substance Use Topics  . Smoking status: Never Smoker   . Smokeless tobacco: Never Used  . Alcohol Use: No   OB History   Grav Para Term Preterm Abortions TAB SAB Ect Mult Living                 Review of Systems  Constitutional: Negative for fever.  Respiratory: Negative for shortness of breath.   Cardiovascular: Positive for chest pain.  Gastrointestinal: Positive for nausea, vomiting and abdominal pain.  Negative for diarrhea, constipation and abdominal distention.  All other systems reviewed and are negative.     Allergies  Peanut-containing drug products  Home Medications   Prior to Admission medications   Medication Sig Start Date End Date Taking? Authorizing Provider  cabergoline (DOSTINEX) 0.5 MG tablet Take 1 mg by mouth 2 (two) times a week. Mondays and thursdays   Yes Historical Provider, MD  HYDROcodone-acetaminophen (NORCO/VICODIN) 5-325 MG per tablet Take 1 tablet by mouth every 6 (six) hours as needed for moderate pain. 08/04/13   Garald Balding, NP  ondansetron (ZOFRAN ODT) 4 MG disintegrating tablet Take 1 tablet (4 mg total) by mouth every 8 (eight) hours as needed for nausea or vomiting. 08/04/13   Garald Balding, NP   BP 107/76  Pulse 62  Temp(Src) 98.1 F (36.7 C) (Oral)  Resp 13  Ht 5\' 5"  (1.651 m)  Wt 184 lb 8 oz (83.689 kg)  BMI 30.70 kg/m2  SpO2 97%  LMP 03/31/2013 Physical Exam  Nursing note and vitals reviewed. Constitutional: She appears well-developed and well-nourished. No distress.  HENT:  Mouth/Throat: Oropharynx is clear and moist.  Eyes: Pupils are equal, round, and reactive to light.  Neck: Normal range of motion.  Cardiovascular: Normal rate and regular rhythm.   Pulmonary/Chest: Effort normal and breath sounds normal.  Abdominal: Soft. Bowel sounds are normal. She exhibits no distension. There is no splenomegaly or hepatomegaly. There is tenderness in the  right upper quadrant, epigastric area and left upper quadrant. There is no rebound and no guarding.    Musculoskeletal: Normal range of motion.  Neurological: She is alert.  Skin: Skin is warm.    ED Course  Procedures (including critical care time) Labs Review Labs Reviewed  CBC WITH DIFFERENTIAL - Abnormal; Notable for the following:    RBC 5.16 (*)    MCV 73.8 (*)    MCH 24.2 (*)    All other components within normal limits  COMPREHENSIVE METABOLIC PANEL - Abnormal; Notable for  the following:    AST 65 (*)    Total Bilirubin <0.2 (*)    All other components within normal limits  LIPASE, BLOOD  I-STAT TROPOININ, ED    Imaging Review US Abdomen Complete  08/04/2013   CLINICAL DATA:  Abdominal pain.  Nausea.  EXAM: ULTRASOUND ABDOMEN COMPLETE  COMPARISON:  None.  FINDINGS: Gallbladder:  The gallbladder is packed with gallstones with wall echo shadow sign. Gallbladder wall measures 5 mm. No sonographic Murphy sign.  Common bile duct:  Diameter: Enlarged for age, measuring between 6 and 7 mm. No common duct stones.  Liver:  Echogenic compatible with hepatosteatosis. Areas of fatty sparing adjacent to the gallbladder fossa.  IVC:  No abnormality visualized.  Pancreas:  Visualized portion unremarkable.  Spleen:  7.3 cm.  Normal echotexture.  Right Kidney:  Length: 10.7 cm. Echogenicity within normal limits. No mass or hydronephrosis visualized.  Left Kidney:  Length: 12.6 cm. Echogenicity within normal limits. No mass or hydronephrosis visualized.  Abdominal aorta:  No aneurysm visualized.  Other findings:  None.  IMPRESSION: 1. Cholelithiasis with gallbladder wall thickening. No sonographic Murphy sign. This likely represents chronic cholecystitis. 2. Mild dilation of the common bile duct for age is probably due to previous passage of stones. 3. Hepatosteatosis.   Electronically Signed   By: Dereck Ligas M.D.   On: 08/04/2013 16:01     EKG Interpretation   Date/Time:  Tuesday August 04 2013 13:12:40 EDT Ventricular Rate:  72 PR Interval:  164 QRS Duration: 79 QT Interval:  398 QTC Calculation: 435 R Axis:   63 Text Interpretation:  Sinus rhythm Borderline T wave abnormalities  Baseline wander Confirmed by HORTON  MD, COURTNEY ([id number]) on 08/04/2013  1:14:45 PM      MDM  I've asked that we obtain CBC CMet, lipase, keep patient comfortable while we further evaluate.  Her discomfort is no longer feeling any nausea.  Strong family history of gallbladder disease.  We'll  most likely obtain right upper quadrant Ultrasound Patient.  Ultrasound shows that she has a gall bag full of stones and chronic cholecystitis.  Patient is able to tolerate by mouth without difficulty.  In the emergency department.  She will be given prescriptions for Zofran, and Vicodin, and she can use if needed.  For pain at home.  She has been cautioned on the following a bland diet and to be given.  Referral to Gen. surgery to make an appointment for evaluation of elective cholecystectomy Final diagnoses:  Chronic cholecystitis with calculus         Garald Balding, NP 08/04/13 1635

## 2013-08-04 NOTE — ED Notes (Signed)
Care link  Called      No  Unit    ems  Called

## 2013-08-04 NOTE — ED Notes (Signed)
Pt placed into gown and on monitor upon arrival to room. Pt monitored by 5 lead, blood pressure, and pulse ox.  

## 2013-08-04 NOTE — ED Notes (Signed)
Pt alert x4 respirations easy non labored.  

## 2013-08-04 NOTE — ED Provider Notes (Signed)
Medical screening examination/treatment/procedure(s) were performed by non-physician practitioner and as supervising physician I was immediately available for consultation/collaboration.   EKG Interpretation   Date/Time:  Tuesday August 04 2013 13:12:40 EDT Ventricular Rate:  72 PR Interval:  164 QRS Duration: 79 QT Interval:  398 QTC Calculation: 435 R Axis:   63 Text Interpretation:  Sinus rhythm Borderline T wave abnormalities  Baseline wander Confirmed by Dina Rich  MD, Velita Quirk ([id number]) on 08/04/2013  1:14:45 PM        Merryl Hacker, MD 08/04/13 2202

## 2013-08-10 ENCOUNTER — Encounter: Payer: Self-pay | Admitting: Nurse Practitioner

## 2013-08-10 ENCOUNTER — Ambulatory Visit (INDEPENDENT_AMBULATORY_CARE_PROVIDER_SITE_OTHER): Payer: Medicaid Other | Admitting: Nurse Practitioner

## 2013-08-10 VITALS — BP 120/75 | HR 85 | Ht 65.0 in | Wt 183.4 lb

## 2013-08-10 DIAGNOSIS — Z5181 Encounter for therapeutic drug level monitoring: Secondary | ICD-10-CM

## 2013-08-10 DIAGNOSIS — D352 Benign neoplasm of pituitary gland: Secondary | ICD-10-CM

## 2013-08-10 DIAGNOSIS — D353 Benign neoplasm of craniopharyngeal duct: Secondary | ICD-10-CM

## 2013-08-10 MED ORDER — CABERGOLINE 0.5 MG PO TABS: 1.0000 mg | ORAL_TABLET | ORAL | Status: DC

## 2013-08-10 NOTE — Progress Notes (Signed)
[facility]  PATIENT: Kari Coffey DOB: 10-01-1975   REASON FOR VISIT: Followup for pituitary macroadenoma  HISTORY OF PRESENT ILLNESS:[name], 38 year old black female returns for follow up. 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 Her last prolactin level  02-09-13 was 103.7.  She remains on Cabergoline twice weekly. Last MRI of the brain 04/01/13 abnormal but  her mass stable and slightly decreased in size as compared to MRI from  2013. She had an ER admission 08/05/2011 for abdominal pain. She has chronic cholecystitis and is due to have surgery in September. She returns for reevaluation. She has no new complaints today .  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 Her last prolactin level in 04/10/12 was elevated at 120.6., previously been 238.3. Patient is on Cabergoline 0.5 mg 2 tablets twice weekly. She denies any headache or visual field changes since last seen. She had prior surgical resection of the macroadenoma. Last MRI of the brain showed decrease in mass size of the pituitary 2/7/ 2013. 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 She is exercising and eating healthy and states she feels better than she has a long time    REVIEW OF SYSTEMS: Full 14 system review of systems performed and notable only for those listed, all others are neg:  Constitutional: N/A  Cardiovascular: N/A  Ear/Nose/Throat: N/A  Skin: N/A  Eyes: N/A  Respiratory: N/A  Gastroitestinal: N/A  Hematology/Lymphatic: N/A  Endocrine: N/A Musculoskeletal:N/A  Allergy/Immunology: N/A  Neurological: N/A Psychiatric: N/A Sleep : NA   ALLERGIES: Allergies  Allergen Reactions  . Peanut-Containing Drug Products Other (See Comments)    Unknown     HOME MEDICATIONS: Outpatient Prescriptions Prior to Visit  Medication Sig Dispense Refill  . cabergoline (DOSTINEX) 0.5 MG tablet Take 1 mg by mouth 2 (two) times  a week. Mondays and thursdays      . HYDROcodone-acetaminophen (NORCO/VICODIN) 5-325 MG per tablet Take 1 tablet by mouth every 6 (six) hours as needed for moderate pain.  12 tablet  0  . ondansetron (ZOFRAN ODT) 4 MG disintegrating tablet Take 1 tablet (4 mg total) by mouth every 8 (eight) hours as needed for nausea or vomiting.  20 tablet  0   No facility-administered medications prior to visit.    PAST MEDICAL HISTORY: Past Medical History  Diagnosis Date  . Brain tumor   . Pituitary macroadenoma   . Obesity     PAST SURGICAL HISTORY: History reviewed. No pertinent past surgical history.  FAMILY HISTORY: Family History  Problem Relation Age of Onset  . Healthy Father   . Healthy Sister   . Healthy Brother   . Healthy Sister   . Healthy Brother   . Healthy Brother   . Healthy Brother     SOCIAL HISTORY: History   Social History  . Marital Status: Single    Spouse Name: N/A    Number of Children: 1  . Years of Education: 12+   Occupational History  . Not on file.   Social History Main Topics  . Smoking status: Never Smoker   . Smokeless tobacco: Never Used  . Alcohol Use: No  . Drug Use: No  . Sexual Activity: Not on file   Other Topics Concern  . Not on file   Social History Narrative   Patient lives at home with son.   Patient has one child.   Patient is right handed.  Patient has her BA.    Patient is single.     PHYSICAL EXAM  Filed Vitals:   08/10/13 1335  BP: 120/75  Pulse: 85  Height: 5\' 5"  (1.651 m)  Weight: 183 lb 6.4 oz (83.19 kg)   Body mass index is 30.52 kg/(m^2). Generalized: Well developed, in no acute distress  Head: normocephalic and atraumatic,. Oropharynx benign  Neck: Supple, no carotid bruits  Cardiac: Regular rate rhythm, no murmur  Musculoskeletal: No deformity  Neurological examination  Mentation: Alert oriented to time, place, history taking. Follows all commands speech and language fluent  Cranial nerve II-XII:  Fundoscopic exam reveals sharp disc margins.Visual acuity 20/50 bilaterally without glasses.  Pupils were equal round reactive to light extraocular movements were full, visual field were full on confrontational test. Facial sensation and strength were normal. Hearing was intact to finger rubbing bilaterally. Uvula tongue midline. Head turning and shoulder shrug were normal and symmetric.Tongue protrusion into cheek strength was normal.  Motor: normal bulk and tone, full strength in the BUE, BLE, fine finger movements normal, no pronator drift. No focal weakness  Sensory: normal and symmetric to light touch, pinprick, and vibration  Coordination: finger-nose-finger, heel-to-shin bilaterally, no dysmetria  Reflexes: 1+ upper lower and symmetric  Gait and Station: Rising up from seated position without assistance, normal stance, moderate stride, good arm swing, smooth turning, able to perform tiptoe, and heel walking without difficulty. Tandem gait is steady   DIAGNOSTIC DATA (LABS, IMAGING, TESTING) - I reviewed patient records, labs, notes, testing and imaging myself where available.  Lab Results  Component Value Date   WBC 5.3 08/04/2013   HGB 12.5 08/04/2013   HCT 38.1 08/04/2013   MCV 73.8* 08/04/2013   PLT 308 08/04/2013      Component Value Date/Time   NA 141 08/04/2013 1319   NA 144 02/09/2013 1425   K 4.0 08/04/2013 1319   CL 106 08/04/2013 1319   CO2 25 08/04/2013 1319   GLUCOSE 92 08/04/2013 1319   GLUCOSE 82 02/09/2013 1425   BUN 9 08/04/2013 1319   BUN 8 02/09/2013 1425   CREATININE 0.68 08/04/2013 1319   CALCIUM 8.9 08/04/2013 1319   PROT 7.3 08/04/2013 1319   ALBUMIN 3.7 08/04/2013 1319   AST 65* 08/04/2013 1319   ALT 21 08/04/2013 1319   ALKPHOS 102 08/04/2013 1319   BILITOT <0.2* 08/04/2013 1319   GFRNONAA >90 08/04/2013 1319   GFRAA >90 08/04/2013 1319     ASSESSMENT AND PLAN  38 y.o. year old female  has a past medical history of Brain tumor; Pituitary macroadenoma; and Obesity.  here to followup. This recent MRI of the brain March 2015 stable from previous in 2013.  Continue Cabergoline at current dose will refill Will check prolactin level today MRI 6 months ago was stable, will recheck in 1.5 years per Dr. Jannifer Franklin. Followup in 6 months Dennie Bible, [facility], [facility], APRN  [facility] [address] [place], [place] [address] [phone]

## 2013-08-10 NOTE — Patient Instructions (Signed)
Continue Cabergoline at current dose Will check prolactin level today MRI 6 months ago was stable, will recheck in 1.5 years. Followup in 6 months

## 2013-08-10 NOTE — Progress Notes (Signed)
I have read the note, and I agree with the clinical assessment and plan.  Kari Coffey,Kari Coffey   

## 2013-08-11 LAB — PROLACTIN: Prolactin: 102 ng/mL — ABNORMAL HIGH (ref 4.8–23.3)

## 2013-09-15 ENCOUNTER — Ambulatory Visit (INDEPENDENT_AMBULATORY_CARE_PROVIDER_SITE_OTHER): Payer: Medicaid Other | Admitting: General Surgery

## 2013-09-15 ENCOUNTER — Other Ambulatory Visit (INDEPENDENT_AMBULATORY_CARE_PROVIDER_SITE_OTHER): Payer: Self-pay | Admitting: General Surgery

## 2013-10-01 ENCOUNTER — Encounter (INDEPENDENT_AMBULATORY_CARE_PROVIDER_SITE_OTHER): Payer: Self-pay | Admitting: General Surgery

## 2013-10-08 ENCOUNTER — Encounter (HOSPITAL_COMMUNITY): Payer: Self-pay

## 2013-10-09 ENCOUNTER — Encounter (HOSPITAL_COMMUNITY): Payer: Self-pay

## 2013-10-09 ENCOUNTER — Encounter (HOSPITAL_COMMUNITY)
Admission: RE | Admit: 2013-10-09 | Discharge: 2013-10-09 | Disposition: A | Discharge status: Home / Self Care | Payer: Medicaid Other | Source: Ambulatory Visit | Attending: General Surgery | Admitting: General Surgery

## 2013-10-09 DIAGNOSIS — Z01812 Encounter for preprocedural laboratory examination: Secondary | ICD-10-CM | POA: Insufficient documentation

## 2013-10-09 DIAGNOSIS — K8020 Calculus of gallbladder without cholecystitis without obstruction: Secondary | ICD-10-CM | POA: Diagnosis present

## 2013-10-09 HISTORY — DX: Headache: R51

## 2013-10-09 HISTORY — DX: Gastro-esophageal reflux disease without esophagitis: K21.9

## 2013-10-09 HISTORY — DX: Sickle-cell trait: D57.3

## 2013-10-09 HISTORY — DX: Pneumonia, unspecified organism: J18.9

## 2013-10-09 LAB — COMPREHENSIVE METABOLIC PANEL
ALBUMIN: 3.8 g/dL (ref 3.5–5.2)
ALK PHOS: 84 U/L (ref 39–117)
ALT: 7 U/L (ref 0–35)
ANION GAP: 11 (ref 5–15)
AST: 14 U/L (ref 0–37)
BUN: 6 mg/dL (ref 6–23)
CALCIUM: 9.2 mg/dL (ref 8.4–10.5)
CO2: 26 mEq/L (ref 19–32)
Chloride: 103 mEq/L (ref 96–112)
Creatinine, Ser: 0.7 mg/dL (ref 0.50–1.10)
GFR calc non Af Amer: >90 mL/min (ref >90)
GLUCOSE: 85 mg/dL (ref 70–99)
POTASSIUM: 4.1 meq/L (ref 3.7–5.3)
SODIUM: 140 meq/L (ref 137–147)
Total Bilirubin: <0.2 mg/dL — ABNORMAL LOW (ref 0.3–1.2)
Total Protein: 7.4 g/dL (ref 6.0–8.3)

## 2013-10-09 LAB — CBC WITH DIFFERENTIAL/PLATELET
BASOS ABS: 0 10*3/uL (ref 0.0–0.1)
Basophils Relative: 0 % (ref 0–1)
Eosinophils Absolute: 0.2 10*3/uL (ref 0.0–0.7)
Eosinophils Relative: 4 % (ref 0–5)
HCT: 34.8 % — ABNORMAL LOW (ref 36.0–46.0)
Hemoglobin: 11.4 g/dL — ABNORMAL LOW (ref 12.0–15.0)
LYMPHS PCT: 39 % (ref 12–46)
Lymphs Abs: 1.7 10*3/uL (ref 0.7–4.0)
MCH: 24.2 pg — AB (ref 26.0–34.0)
MCHC: 32.8 g/dL (ref 30.0–36.0)
MCV: 73.9 fL — ABNORMAL LOW (ref 78.0–100.0)
MONO ABS: 0.3 10*3/uL (ref 0.1–1.0)
Monocytes Relative: 7 % (ref 3–12)
NEUTROS PCT: 50 % (ref 43–77)
Neutro Abs: 2.2 10*3/uL (ref 1.7–7.7)
PLATELETS: 290 10*3/uL (ref 150–400)
RBC: 4.71 MIL/uL (ref 3.87–5.11)
RDW: 13.7 % (ref 11.5–15.5)
WBC: 4.4 10*3/uL (ref 4.0–10.5)

## 2013-10-09 LAB — HCG, SERUM, QUALITATIVE: Preg, Serum: NEGATIVE

## 2013-10-09 NOTE — Pre-Procedure Instructions (Signed)
Kari Coffey  10/09/2013   Your procedure is scheduled on:  Monday, October 5.  Report to [facility] Admitting at 5:30AM.  Call this number if you have problems the morning of surgery: [phone]   Remember:   Do not eat food or drink liquids after midnight Sunday, October 4.  Take these medicines the morning of surgery with A SIP OF WATER: Zofran and Hydrocodone- Acetaminophen if needed.   Do not wear jewelry, make-up or nail polish.  Do not wear lotions, powders, or perfumes.   Do not shave 48 hours prior to surgery.   Do not bring valuables to the hospital.              [facility] is not responsible  for any belongings or valuables.               Contacts, dentures or bridgework may not be worn into surgery.  Leave suitcase in the car. After surgery it may be brought to your room.  For patients admitted to the hospital, discharge time is determined by your treatment team.               Patients discharged the day of surgery will not be allowed to drive home.  Name and phone number of your driver: -   Special Instructions: Review  [place] - Preparing For Surgery.   Please read over the following fact sheets that you were given: Pain Booklet, Coughing and Deep Breathing and Surgical Site Infection Prevention

## 2013-10-18 MED ORDER — CHLORHEXIDINE GLUCONATE 4 % EX LIQD
1.0000 "application " | Freq: Once | CUTANEOUS | Status: DC
  Filled 2013-10-18: qty 15

## 2013-10-18 MED ORDER — DEXTROSE 5 % IV SOLN
2.0000 g | INTRAVENOUS | Status: AC
  Administered 2013-10-19: 2 g via INTRAVENOUS
  Filled 2013-10-18: qty 2

## 2013-10-19 ENCOUNTER — Ambulatory Visit (HOSPITAL_COMMUNITY)
Admission: RE | Admit: 2013-10-19 | Discharge: 2013-10-19 | Disposition: A | Discharge status: Home / Self Care | Payer: Medicaid Other | Source: Ambulatory Visit | Attending: General Surgery | Admitting: General Surgery

## 2013-10-19 ENCOUNTER — Encounter (HOSPITAL_COMMUNITY): Payer: Self-pay | Admitting: *Deleted

## 2013-10-19 ENCOUNTER — Encounter (HOSPITAL_COMMUNITY)
Admission: RE | Disposition: A | Discharge status: Home / Self Care | Payer: Self-pay | Source: Ambulatory Visit | Attending: General Surgery

## 2013-10-19 ENCOUNTER — Encounter (HOSPITAL_COMMUNITY): Payer: Medicaid Other | Admitting: Anesthesiology

## 2013-10-19 ENCOUNTER — Ambulatory Visit (HOSPITAL_COMMUNITY): Payer: Medicaid Other | Admitting: Anesthesiology

## 2013-10-19 DIAGNOSIS — K8010 Calculus of gallbladder with chronic cholecystitis without obstruction: Secondary | ICD-10-CM | POA: Diagnosis not present

## 2013-10-19 DIAGNOSIS — K8020 Calculus of gallbladder without cholecystitis without obstruction: Secondary | ICD-10-CM | POA: Diagnosis present

## 2013-10-19 HISTORY — PX: CHOLECYSTECTOMY: SHX55

## 2013-10-19 SURGERY — LAPAROSCOPIC CHOLECYSTECTOMY WITH INTRAOPERATIVE CHOLANGIOGRAM
Anesthesia: General | Site: Abdomen

## 2013-10-19 MED ORDER — FENTANYL CITRATE 0.05 MG/ML IJ SOLN
INTRAMUSCULAR | Status: AC
  Filled 2013-10-19: qty 5

## 2013-10-19 MED ORDER — FENTANYL CITRATE 0.05 MG/ML IJ SOLN
INTRAMUSCULAR | Status: DC | PRN
  Administered 2013-10-19 (×5): 50 ug via INTRAVENOUS

## 2013-10-19 MED ORDER — MIDAZOLAM HCL 2 MG/2ML IJ SOLN
INTRAMUSCULAR | Status: AC
  Filled 2013-10-19: qty 2

## 2013-10-19 MED ORDER — ONDANSETRON HCL 4 MG/2ML IJ SOLN
INTRAMUSCULAR | Status: DC | PRN
  Administered 2013-10-19: 4 mg via INTRAVENOUS

## 2013-10-19 MED ORDER — DEXAMETHASONE SODIUM PHOSPHATE 4 MG/ML IJ SOLN
INTRAMUSCULAR | Status: DC | PRN
  Administered 2013-10-19: 4 mg via INTRAVENOUS

## 2013-10-19 MED ORDER — PROPOFOL 10 MG/ML IV BOLUS
INTRAVENOUS | Status: DC | PRN
  Administered 2013-10-19: 160 mg via INTRAVENOUS

## 2013-10-19 MED ORDER — BUPIVACAINE-EPINEPHRINE 0.25% -1:200000 IJ SOLN
INTRAMUSCULAR | Status: DC | PRN
  Administered 2013-10-19: 30 mL

## 2013-10-19 MED ORDER — EPHEDRINE SULFATE 50 MG/ML IJ SOLN
INTRAMUSCULAR | Status: AC
  Filled 2013-10-19: qty 1

## 2013-10-19 MED ORDER — ROCURONIUM BROMIDE 50 MG/5ML IV SOLN
INTRAVENOUS | Status: AC
  Filled 2013-10-19: qty 1

## 2013-10-19 MED ORDER — PROPOFOL 10 MG/ML IV BOLUS
INTRAVENOUS | Status: AC
  Filled 2013-10-19: qty 20

## 2013-10-19 MED ORDER — HYDROMORPHONE HCL 1 MG/ML IJ SOLN
0.2500 mg | INTRAMUSCULAR | Status: DC | PRN
  Administered 2013-10-19 (×2): 0.5 mg via INTRAVENOUS

## 2013-10-19 MED ORDER — LIDOCAINE HCL (CARDIAC) 20 MG/ML IV SOLN
INTRAVENOUS | Status: AC
  Filled 2013-10-19: qty 5

## 2013-10-19 MED ORDER — MIDAZOLAM HCL 5 MG/5ML IJ SOLN
INTRAMUSCULAR | Status: DC | PRN
  Administered 2013-10-19 (×2): 1 mg via INTRAVENOUS

## 2013-10-19 MED ORDER — NEOSTIGMINE METHYLSULFATE 10 MG/10ML IV SOLN
INTRAVENOUS | Status: AC
  Filled 2013-10-19: qty 1

## 2013-10-19 MED ORDER — ARTIFICIAL TEARS OP OINT
TOPICAL_OINTMENT | OPHTHALMIC | Status: DC | PRN
  Administered 2013-10-19: 1 via OPHTHALMIC

## 2013-10-19 MED ORDER — ROCURONIUM BROMIDE 100 MG/10ML IV SOLN
INTRAVENOUS | Status: DC | PRN
  Administered 2013-10-19: 40 mg via INTRAVENOUS

## 2013-10-19 MED ORDER — NEOSTIGMINE METHYLSULFATE 10 MG/10ML IV SOLN
INTRAVENOUS | Status: DC | PRN
  Administered 2013-10-19: 4 mg via INTRAVENOUS

## 2013-10-19 MED ORDER — LIDOCAINE HCL (CARDIAC) 20 MG/ML IV SOLN
INTRAVENOUS | Status: DC | PRN
  Administered 2013-10-19: 80 mg via INTRAVENOUS

## 2013-10-19 MED ORDER — LACTATED RINGERS IV SOLN
INTRAVENOUS | Status: DC | PRN
  Administered 2013-10-19 (×2): via INTRAVENOUS

## 2013-10-19 MED ORDER — HYDROMORPHONE HCL 1 MG/ML IJ SOLN
INTRAMUSCULAR | Status: AC
  Filled 2013-10-19: qty 1

## 2013-10-19 MED ORDER — HYDROCODONE-ACETAMINOPHEN 5-325 MG PO TABS: 1.0000 | ORAL_TABLET | Freq: Four times a day (QID) | ORAL | Status: DC | PRN

## 2013-10-19 MED ORDER — BUPIVACAINE-EPINEPHRINE (PF) 0.25% -1:200000 IJ SOLN
INTRAMUSCULAR | Status: AC
  Filled 2013-10-19: qty 30

## 2013-10-19 MED ORDER — SUCCINYLCHOLINE CHLORIDE 20 MG/ML IJ SOLN
INTRAMUSCULAR | Status: AC
  Filled 2013-10-19: qty 1

## 2013-10-19 MED ORDER — SODIUM CHLORIDE 0.9 % IR SOLN
Status: DC | PRN
  Administered 2013-10-19: 1000 mL

## 2013-10-19 MED ORDER — 0.9 % SODIUM CHLORIDE (POUR BTL) OPTIME
TOPICAL | Status: DC | PRN
  Administered 2013-10-19: 1000 mL

## 2013-10-19 MED ORDER — DEXAMETHASONE SODIUM PHOSPHATE 4 MG/ML IJ SOLN
INTRAMUSCULAR | Status: AC
  Filled 2013-10-19: qty 1

## 2013-10-19 MED ORDER — ONDANSETRON HCL 4 MG/2ML IJ SOLN
INTRAMUSCULAR | Status: AC
  Filled 2013-10-19: qty 2

## 2013-10-19 MED ORDER — GLYCOPYRROLATE 0.2 MG/ML IJ SOLN
INTRAMUSCULAR | Status: DC | PRN
  Administered 2013-10-19: 0.6 mg via INTRAVENOUS

## 2013-10-19 MED ORDER — PROMETHAZINE HCL 25 MG/ML IJ SOLN: 6.2500 mg | INTRAMUSCULAR | Status: DC | PRN

## 2013-10-19 MED ORDER — GLYCOPYRROLATE 0.2 MG/ML IJ SOLN
INTRAMUSCULAR | Status: AC
  Filled 2013-10-19: qty 3

## 2013-10-19 MED ORDER — STERILE WATER FOR INJECTION IJ SOLN
INTRAMUSCULAR | Status: AC
  Filled 2013-10-19: qty 10

## 2013-10-19 SURGICAL SUPPLY — 50 items
APPLIER CLIP 5 13 M/L LIGAMAX5 (MISCELLANEOUS)
APPLIER CLIP ROT 10 11.4 M/L (STAPLE) ×3
BLADE SURG ROTATE 9660 (MISCELLANEOUS) IMPLANT
CANISTER SUCTION 2500CC (MISCELLANEOUS) ×3 IMPLANT
CHLORAPREP W/TINT 26ML (MISCELLANEOUS) ×3 IMPLANT
CLIP APPLIE 5 13 M/L LIGAMAX5 (MISCELLANEOUS) IMPLANT
CLIP APPLIE ROT 10 11.4 M/L (STAPLE) ×1 IMPLANT
CLOSURE WOUND 1/2 X4 (GAUZE/BANDAGES/DRESSINGS) ×1
CLOTH BEACON ORANGE TIMEOUT ST (SAFETY) ×3 IMPLANT
COVER MAYO STAND STRL (DRAPES) IMPLANT
COVER SURGICAL LIGHT HANDLE (MISCELLANEOUS) ×3 IMPLANT
DERMABOND ADVANCED (GAUZE/BANDAGES/DRESSINGS) ×2
DERMABOND ADVANCED .7 DNX12 (GAUZE/BANDAGES/DRESSINGS) ×1 IMPLANT
DRAPE C-ARM 42X72 X-RAY (DRAPES) IMPLANT
DRAPE UTILITY 15X26 W/TAPE STR (DRAPE) ×6 IMPLANT
DRSG TEGADERM 2-3/8X2-3/4 SM (GAUZE/BANDAGES/DRESSINGS) ×12 IMPLANT
ELECT REM PT RETURN 9FT ADLT (ELECTROSURGICAL) ×3
ELECTRODE REM PT RTRN 9FT ADLT (ELECTROSURGICAL) ×1 IMPLANT
GLOVE BIO SURGEON STRL SZ7.5 (GLOVE) ×3 IMPLANT
GLOVE BIOGEL PI IND STRL 7.0 (GLOVE) ×2 IMPLANT
GLOVE BIOGEL PI IND STRL 7.5 (GLOVE) ×1 IMPLANT
GLOVE BIOGEL PI IND STRL 8 (GLOVE) ×1 IMPLANT
GLOVE BIOGEL PI INDICATOR 7.0 (GLOVE) ×4
GLOVE BIOGEL PI INDICATOR 7.5 (GLOVE) ×2
GLOVE BIOGEL PI INDICATOR 8 (GLOVE) ×2
GLOVE ECLIPSE 7.5 STRL STRAW (GLOVE) ×3 IMPLANT
GLOVE SURG SS PI 6.5 STRL IVOR (GLOVE) ×3 IMPLANT
GLOVE SURG SS PI 7.0 STRL IVOR (GLOVE) ×3 IMPLANT
GOWN STRL REUS W/ TWL LRG LVL3 (GOWN DISPOSABLE) ×3 IMPLANT
GOWN STRL REUS W/TWL LRG LVL3 (GOWN DISPOSABLE) ×6
KIT BASIN OR (CUSTOM PROCEDURE TRAY) ×3 IMPLANT
KIT ROOM TURNOVER OR (KITS) ×3 IMPLANT
NS IRRIG 1000ML POUR BTL (IV SOLUTION) ×3 IMPLANT
PAD ARMBOARD 7.5X6 YLW CONV (MISCELLANEOUS) ×3 IMPLANT
PAD SHARPS MAGNETIC DISPOSAL (MISCELLANEOUS) ×3 IMPLANT
POUCH SPECIMEN RETRIEVAL 10MM (ENDOMECHANICALS) ×3 IMPLANT
SCISSORS LAP 5X35 DISP (ENDOMECHANICALS) ×3 IMPLANT
SET CHOLANGIOGRAPH 5 50 .035 (SET/KITS/TRAYS/PACK) IMPLANT
SET IRRIG TUBING LAPAROSCOPIC (IRRIGATION / IRRIGATOR) ×3 IMPLANT
SLEEVE ENDOPATH XCEL 5M (ENDOMECHANICALS) ×3 IMPLANT
SPECIMEN JAR SMALL (MISCELLANEOUS) ×3 IMPLANT
STRIP CLOSURE SKIN 1/2X4 (GAUZE/BANDAGES/DRESSINGS) ×2 IMPLANT
SUT MNCRL AB 4-0 PS2 18 (SUTURE) ×3 IMPLANT
TOWEL OR 17X24 6PK STRL BLUE (TOWEL DISPOSABLE) IMPLANT
TOWEL OR 17X26 10 PK STRL BLUE (TOWEL DISPOSABLE) ×3 IMPLANT
TRAY LAPAROSCOPIC (CUSTOM PROCEDURE TRAY) ×3 IMPLANT
TROCAR XCEL BLUNT TIP 100MML (ENDOMECHANICALS) ×3 IMPLANT
TROCAR XCEL NON-BLD 11X100MML (ENDOMECHANICALS) ×3 IMPLANT
TROCAR XCEL NON-BLD 5MMX100MML (ENDOMECHANICALS) ×3 IMPLANT
TUBING INSUFFLATION (TUBING) ×3 IMPLANT

## 2013-10-19 NOTE — Anesthesia Postprocedure Evaluation (Signed)
  Anesthesia Post-op Note  Patient: Kari Coffey  Procedure(s) Performed: Procedure(s) (LRB): LAPAROSCOPIC CHOLECYSTECTOMY  (N/A)  Patient Location: PACU  Anesthesia Type: General  Level of Consciousness: awake and alert   Airway and Oxygen Therapy: Patient Spontanous Breathing  Post-op Pain: mild  Post-op Assessment: Post-op Vital signs reviewed, Patient's Cardiovascular Status Stable, Respiratory Function Stable, Patent Airway and No signs of Nausea or vomiting  Last Vitals:  Filed Vitals:   10/19/13 0952  BP:   Pulse:   Temp: 36.1 C  Resp:     Post-op Vital Signs: stable   Complications: No apparent anesthesia complications

## 2013-10-19 NOTE — H&P (Signed)
  Printed H&P on the chart.  Patient ready for the OR.  Kari Coffey. Dahlia Bailiff, MD, [place] [phone] [facility]

## 2013-10-19 NOTE — Discharge Instructions (Addendum)
Laparoscopic Cholecystectomy, Care After Refer to this sheet in the next few weeks. These instructions provide you with information on caring for yourself after your procedure. Your health care provider may also give you more specific instructions. Your treatment has been planned according to current medical practices, but problems sometimes occur. Call your health care provider if you have any problems or questions after your procedure.  Call to make appointment to see Dr. Hulen Skains in 2-3 weeks.  WHAT TO EXPECT AFTER THE PROCEDURE After your procedure, it is typical to have the following:  Pain at your incision sites. You will be given pain medicines to control the pain.  Mild nausea or vomiting. This should improve after the first 24 hours.  Bloating and possibly shoulder pain from the gas used during the procedure. This will improve after the first 24 hours. HOME CARE INSTRUCTIONS   Change bandages (dressings) as directed by your health care provider.  Keep the wound dry and clean. You may wash the wound gently with soap and water. Gently blot or dab the area dry.  Do not take baths or use swimming pools or hot tubs for 2 weeks or until your health care provider approves.  Only take over-the-counter or prescription medicines as directed by your health care provider.  Continue your normal diet as directed by your health care provider.  Do not lift anything heavier than 10 pounds (4.5 kg) until your health care provider approves.  Do not play contact sports for 1 week or until your health care provider approves.  May shower and pat wounds dry. SEEK MEDICAL CARE IF:   You have redness, swelling, or increasing pain in the wound.  You notice yellowish-white fluid (pus) coming from the wound.  You have drainage from the wound that lasts longer than 1 day.  You notice a bad smell coming from the wound or dressing.  Your surgical cuts (incisions) break open. SEEK IMMEDIATE MEDICAL  CARE IF:   You develop a rash.  You have difficulty breathing.  You have chest pain.  You have a fever.  You have increasing pain in the shoulders (shoulder strap areas).  You have dizzy episodes or faint while standing.  You have severe abdominal pain.  You feel sick to your stomach (nauseous) or throw up (vomit) and this lasts for more than 1 day. Document Released: 01/01/2005 Document Revised: 10/22/2012 Document Reviewed: 08/13/2012 [facility] Patient Information 2015 [place], [place]. This information is not intended to replace advice given to you by your health care provider. Make sure you discuss any questions you have with your health care provider.   What to eat:  For your first meals, you should eat lightly; only small meals initially.  If you do not have nausea, you may eat larger meals.  Avoid spicy, greasy and heavy food.    General Anesthesia, Adult, Care After  Refer to this sheet in the next few weeks. These instructions provide you with information on caring for yourself after your procedure. Your health care provider may also give you more specific instructions. Your treatment has been planned according to current medical practices, but problems sometimes occur. Call your health care provider if you have any problems or questions after your procedure.  WHAT TO EXPECT AFTER THE PROCEDURE  After the procedure, it is typical to experience:  Sleepiness.  Nausea and vomiting. HOME CARE INSTRUCTIONS  For the first 24 hours after general anesthesia:  Have a responsible person with you.  Do not drive  a car. If you are alone, do not take public transportation.  Do not drink alcohol.  Do not take medicine that has not been prescribed by your health care provider.  Do not sign important papers or make important decisions.  You may resume a normal diet and activities as directed by your health care provider.  Change bandages (dressings) as directed.  If you have questions  or problems that seem related to general anesthesia, call the hospital and ask for the anesthetist or anesthesiologist on call. SEEK MEDICAL CARE IF:  You have nausea and vomiting that continue the day after anesthesia.  You develop a rash. SEEK IMMEDIATE MEDICAL CARE IF:  You have difficulty breathing.  You have chest pain.  You have any allergic problems. Document Released: 04/09/2000 Document Revised: 09/03/2012 Document Reviewed: 07/17/2012  [facility] Patient Information 2014 [place], [place].

## 2013-10-19 NOTE — Anesthesia Preprocedure Evaluation (Signed)
Anesthesia Evaluation  Patient identified by MRN, date of birth, ID band Patient awake    Reviewed: Allergy & Precautions, H&P , NPO status , Patient's Chart, lab work & pertinent test results  Airway Mallampati: II TM Distance: >3 FB Neck ROM: Full    Dental no notable dental hx.    Pulmonary pneumonia -,  breath sounds clear to auscultation  Pulmonary exam normal       Cardiovascular negative cardio ROS  Rhythm:Regular Rate:Normal     Neuro/Psych  Headaches, Pituitary adenoma negative psych ROS   GI/Hepatic Neg liver ROS, GERD-  ,  Endo/Other  negative endocrine ROS  Renal/GU negative Renal ROS  negative genitourinary   Musculoskeletal negative musculoskeletal ROS (+)   Abdominal (+) + obese,   Peds negative pediatric ROS (+)  Hematology negative hematology ROS (+)   Anesthesia Other Findings   Reproductive/Obstetrics negative OB ROS                           Anesthesia Physical Anesthesia Plan  ASA: II  Anesthesia Plan: General   Post-op Pain Management:    Induction: Intravenous  Airway Management Planned: Oral ETT  Additional Equipment:   Intra-op Plan:   Post-operative Plan: Extubation in OR  Informed Consent: I have reviewed the patients History and Physical, chart, labs and discussed the procedure including the risks, benefits and alternatives for the proposed anesthesia with the patient or authorized representative who has indicated his/her understanding and acceptance.   Dental advisory given  Plan Discussed with: CRNA  Anesthesia Plan Comments:         Anesthesia Quick Evaluation

## 2013-10-19 NOTE — Anesthesia Procedure Notes (Signed)
Procedure Name: Intubation Date/Time: 10/19/2013 7:41 AM Performed by: Susa Loffler Pre-anesthesia Checklist: Patient identified, Timeout performed, Emergency Drugs available, Suction available and Patient being monitored Patient Re-evaluated:Patient Re-evaluated prior to inductionOxygen Delivery Method: Circle system utilized Preoxygenation: Pre-oxygenation with 100% oxygen Intubation Type: IV induction Ventilation: Mask ventilation without difficulty Laryngoscope Size: Mac and 3 Grade View: Grade I Tube type: Oral Tube size: 7.0 mm Number of attempts: 1 Airway Equipment and Method: Stylet Placement Confirmation: ETT inserted through vocal cords under direct vision,  positive ETCO2 and breath sounds checked- equal and bilateral Secured at: 21 cm Tube secured with: Tape Dental Injury: Teeth and Oropharynx as per pre-operative assessment

## 2013-10-19 NOTE — Progress Notes (Signed)
Pt getting up to bath room

## 2013-10-19 NOTE — Op Note (Signed)
OPERATIVE REPORT  DATE OF OPERATION: 10/19/2013  PATIENT:  Kari Coffey  38 y.o. female  PRE-OPERATIVE DIAGNOSIS:  Gallstones  POST-OPERATIVE DIAGNOSIS:  Gallstones  PROCEDURE:  Procedure(s): LAPAROSCOPIC CHOLECYSTECTOMY   SURGEON:  Surgeon(s): Doreen Salvage, MD  ASSISTANT: RNFA, Bowman  ANESTHESIA:   general  EBL: <20 ml  BLOOD ADMINISTERED: none  DRAINS: none   SPECIMEN:  Source of Specimen:  Gallbladder and stones  COUNTS CORRECT:  YES  PROCEDURE DETAILS: The patient was taken to the operating room and placed on the table in the supine position.  After an adequate endotracheal anesthetic was administered, the patient was prepped with ChloroPrep, and then draped in the usual manner exposing the entire abdomen laterally, inferiorly and up  to the costal margins.  After a proper timeout was performed including identifying the patient and the procedure to be performed, a infraumbilical 1.6XW midline incision was made using a #15 blade.  This was taken down to the fascia which was then incised with a #15 blade.  The edges of the fascia were tented up with Kocher clamps as the preperitoneal space was penetrated with a Kelly clamp into the peritoneum.  Once this was done, a pursestring suture of 0 Vicryl was passed around the fascial opening.  This was subsequently used to secure the [facility] cannula which was passed into the peritoneal cavity.  Once the [facility] cannula was in place, carbon dioxide gas was insufflated into the peritoneal cavity up to a maximal intra-abdominal pressure of 38mm Hg.The laparoscope, with attached camera and light source, was passed into the peritoneal cavity to visualize the direct insertion of two right upper quadrant 59mm cannulas, and a sup-xiphoid 46mm cannula.  Once all cannulas were in place, the dissection was begun.  Two ratcheted graspers were attached to the dome and infundibulum of the gallbladder and retracted towards the anterior abdominal wall and  the right upper quadrant.  Using cautery attached to a dissecting forceps, the peritoneum overlaying the triangle of Chalot and the hepatoduodenal triangle was dissected away exposing the cystic duct and the cystic artery.  The cystic artery was clipped proximally and distally then transected.  A clip was placed on the gallbladder side of the cystic duct, then the distal cystic duct was clipped multiple times then transected.  No cholangiogram was performed.  The gallbladder was then dissected out of the hepatic bed without event.  It was retrieved from the abdomen using an EndoCatch bag, However the back at to be open and the gallbladder opened in order to retrieve multiple stones which were contained within the gallbladder and significantly impeded the passage of the sac from the fascial incision. The was mild examination of the incision..  Once the gallbladder was removed, the bed was inspected for hemostasis.  Once excellent hemostasis was obtained all gas and fluids were aspirated from above the liver, then the cannulas were removed.  The infraumbilical incision was closed using the pursestring suture which was in place.  0.25% bupivicaine with epinephrine was injected at all sites.  All 72mm or greater cannula sites were close using a running subcuticular stitch of 4-0 Monocryl.  5.75mm cannula sites were closed with Dermabond only.Steri-Strips and Tagaderm were used to complete the dressings at all sites.  At this point all needle, sponge, and instrument counts were correct.The patient was awakened from anesthesia and taken to the PACU in stable condition.   PATIENT DISPOSITION:  PACU - hemodynamically stable.   Jerzy Roepke, JAY 10/5/20158:36 AM

## 2013-10-19 NOTE — Progress Notes (Signed)
Report given to abngel rn as caregiver

## 2013-10-19 NOTE — Transfer of Care (Signed)
Immediate Anesthesia Transfer of Care Note  Patient: Kari Coffey  Procedure(s) Performed: Procedure(s): LAPAROSCOPIC CHOLECYSTECTOMY  (N/A)  Patient Location: PACU  Anesthesia Type:General  Level of Consciousness: awake, alert  and oriented  Airway & Oxygen Therapy: Patient Spontanous Breathing and Patient connected to nasal cannula oxygen  Post-op Assessment: Report given to PACU RN and Post -op Vital signs reviewed and stable  Post vital signs: Reviewed and stable  Complications: No apparent anesthesia complications

## 2013-10-22 ENCOUNTER — Encounter (HOSPITAL_COMMUNITY): Payer: Self-pay | Admitting: General Surgery

## 2013-12-04 NOTE — H&P (Signed)
Roshini Fulwider 09/15/2013 11:44 AM Location: [facility] Patient #: [id number] DOB: 11-Sep-1975 Single / Language: Cleophus Molt / Race: Black or African American Female  History of Present Illness Coletta Memos RMA; 09/15/2013 11:48 AM) The patient is a 38 year old female who presents for evaluation of gall stones.   Other Problems [facility] [place], [place]; 09/15/2013 11:45 AM) Gastroesophageal Reflux Disease  Diagnostic Studies History ([place], [place]; 09/15/2013 11:45 AM) Colonoscopy never Mammogram within last year Pap Smear 1-5 years ago  Allergies [facility] [place], RMA; 09/15/2013 11:54 AM) Peanuts No Known Drug Allergies09/01/2013  Medication History ([place], RMA; 09/15/2013 11:54 AM) Dostinex (0.5MG  Tablet, Oral) Active. Hydrocodone-Acetaminophen (5-325MG  Tablet, Oral) Active. Zofran (4MG  Tablet, Oral) Active. Medications Reconciled  Social History Alexander Bergeron [place], [place]; 09/15/2013 11:45 AM) No alcohol use No caffeine use No drug use Tobacco use Never smoker.  Family History Alexander Bergeron [place], [place]; 09/15/2013 11:45 AM) Arthritis Mother. Diabetes Mellitus Mother. Hypertension Mother.  Pregnancy / Birth History Alexander Bergeron [place], [place]; 09/15/2013 11:45 AM) Durenda Age 3 Irregular periods Maternal age 76-20 Para 1  Review of Systems ([place] RMA; 09/15/2013 11:45 AM) General Not Present- Appetite Loss, Chills, Fatigue, Fever, Night Sweats, Weight Gain and Weight Loss. Skin Not Present- Change in Wart/Mole, Dryness, Hives, Jaundice, New Lesions, Non-Healing Wounds, Rash and Ulcer. HEENT Not Present- Earache, Hearing Loss, Hoarseness, Nose Bleed, Oral Ulcers, Ringing in the Ears, Seasonal Allergies, Sinus Pain, Sore Throat, Visual Disturbances, Wears glasses/contact lenses and Yellow Eyes. Respiratory Not Present- Bloody sputum, Chronic Cough, Difficulty Breathing, Snoring and Wheezing. Breast  Not Present- Breast Mass, Breast Pain, Nipple Discharge and Skin Changes. Cardiovascular Not Present- Chest Pain, Difficulty Breathing Lying Down, Leg Cramps, Palpitations, Rapid Heart Rate, Shortness of Breath and Swelling of Extremities. Gastrointestinal Not Present- Abdominal Pain, Bloating, Bloody Stool, Change in Bowel Habits, Chronic diarrhea, Constipation, Difficulty Swallowing, Excessive gas, Gets full quickly at meals, Hemorrhoids, Indigestion, Nausea, Rectal Pain and Vomiting. Female Genitourinary Not Present- Frequency, Nocturia, Painful Urination, Pelvic Pain and Urgency. Musculoskeletal Not Present- Back Pain, Joint Pain, Joint Stiffness, Muscle Pain, Muscle Weakness and Swelling of Extremities. Neurological Not Present- Decreased Memory, Fainting, Headaches, Numbness, Seizures, Tingling, Tremor, Trouble walking and Weakness. Psychiatric Not Present- Anxiety, Bipolar, Change in Sleep Pattern, Depression, Fearful and Frequent crying. Endocrine Not Present- Cold Intolerance, Excessive Hunger, Hair Changes, Heat Intolerance, Hot flashes and New Diabetes. Hematology Not Present- Easy Bruising, Excessive bleeding, Gland problems, HIV and Persistent Infections.   Vitals (Dahionnarah Maldonado RMA; 09/15/2013 11:53 AM) 09/15/2013 11:53 AM Weight: 184 lb Height: 64in Height was reported by patient. Body Surface Area: 1.94 m Body Mass Index: 31.58 kg/m Temp.: 98.17F(Oral)  Pulse: 82 (Regular)  BP: 122/82 (Sitting, Left Arm, Standard)    Physical Exam (Maddelynn Moosman O. Hulen Skains MD; 09/15/2013 12:30 PM) General Mental Status-Alert. Orientation-Oriented X4. Build & Nutrition-Well nourished. Posture-Normal posture.  Head and Neck Neck Global Assessment - full range of motion, no bruit auscultated on the right, no bruit auscultated on the left, no lymphadenopathy.  Abdomen Inspection Inspection of the abdomen reveals - No Visible peristalsis. Palpation/Percussion Palpation  and Percussion of the abdomen reveal - Soft, Non Tender and No Rebound tenderness. Gallbladder - Negative Murphy's sign. Auscultation Auscultation of the abdomen reveals - Bowel sounds normal.    Assessment & Plan Jeneen Rinks O. Denean Pavon MD; 09/15/2013 12:33 PM) SYMPTOMATIC CHOLELITHIASIS (574.20  K80.20) Story: Initial symptoms 6 months ago. One month ago came to the ED, US showed cholelithiasis Impression: Symptomatic cholelithiasis on a bland diet. Current Plans  CBC,  PLATELETS & AUT DIFF (77414) COMPREHENSIVE METABOLIC PANEL (23953) Schedule for Surgery   Signed by Gwenyth Ober, MD (09/15/2013 12:39 PM)  This history and physical was updated the day of surgery.

## 2014-01-08 ENCOUNTER — Encounter (HOSPITAL_COMMUNITY): Payer: Self-pay | Admitting: Emergency Medicine

## 2014-01-08 ENCOUNTER — Emergency Department (HOSPITAL_COMMUNITY)
Admission: EM | Admit: 2014-01-08 | Discharge: 2014-01-09 | Disposition: A | Discharge status: Home / Self Care | Payer: Self-pay | Attending: Emergency Medicine | Admitting: Emergency Medicine

## 2014-01-08 ENCOUNTER — Emergency Department (HOSPITAL_COMMUNITY): Payer: Self-pay

## 2014-01-08 DIAGNOSIS — Z3202 Encounter for pregnancy test, result negative: Secondary | ICD-10-CM | POA: Insufficient documentation

## 2014-01-08 DIAGNOSIS — Y998 Other external cause status: Secondary | ICD-10-CM | POA: Insufficient documentation

## 2014-01-08 DIAGNOSIS — Z862 Personal history of diseases of the blood and blood-forming organs and certain disorders involving the immune mechanism: Secondary | ICD-10-CM | POA: Insufficient documentation

## 2014-01-08 DIAGNOSIS — S199XXA Unspecified injury of neck, initial encounter: Secondary | ICD-10-CM | POA: Insufficient documentation

## 2014-01-08 DIAGNOSIS — Z86011 Personal history of benign neoplasm of the brain: Secondary | ICD-10-CM | POA: Insufficient documentation

## 2014-01-08 DIAGNOSIS — E669 Obesity, unspecified: Secondary | ICD-10-CM | POA: Insufficient documentation

## 2014-01-08 DIAGNOSIS — S3992XA Unspecified injury of lower back, initial encounter: Secondary | ICD-10-CM | POA: Insufficient documentation

## 2014-01-08 DIAGNOSIS — S6992XA Unspecified injury of left wrist, hand and finger(s), initial encounter: Secondary | ICD-10-CM | POA: Insufficient documentation

## 2014-01-08 DIAGNOSIS — S24109A Unspecified injury at unspecified level of thoracic spinal cord, initial encounter: Secondary | ICD-10-CM | POA: Insufficient documentation

## 2014-01-08 DIAGNOSIS — M545 Low back pain, unspecified: Secondary | ICD-10-CM

## 2014-01-08 DIAGNOSIS — M542 Cervicalgia: Secondary | ICD-10-CM

## 2014-01-08 DIAGNOSIS — Z8701 Personal history of pneumonia (recurrent): Secondary | ICD-10-CM | POA: Insufficient documentation

## 2014-01-08 DIAGNOSIS — Y92410 Unspecified street and highway as the place of occurrence of the external cause: Secondary | ICD-10-CM | POA: Insufficient documentation

## 2014-01-08 DIAGNOSIS — M25532 Pain in left wrist: Secondary | ICD-10-CM

## 2014-01-08 DIAGNOSIS — K219 Gastro-esophageal reflux disease without esophagitis: Secondary | ICD-10-CM | POA: Insufficient documentation

## 2014-01-08 DIAGNOSIS — Y9389 Activity, other specified: Secondary | ICD-10-CM | POA: Insufficient documentation

## 2014-01-08 LAB — PREGNANCY, URINE: Preg Test, Ur: NEGATIVE

## 2014-01-08 MED ORDER — IBUPROFEN 800 MG PO TABS
800.0000 mg | ORAL_TABLET | Freq: Once | ORAL | Status: AC
  Administered 2014-01-08: 800 mg via ORAL
  Filled 2014-01-08: qty 1
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
## 2014-01-09 MED ORDER — IBUPROFEN 800 MG PO TABS: 800.0000 mg | ORAL_TABLET | Freq: Three times a day (TID) | ORAL | Status: DC

## 2014-01-09 MED ORDER — DIAZEPAM 5 MG PO TABS: 5.0000 mg | ORAL_TABLET | Freq: Two times a day (BID) | ORAL | Status: DC
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
## 2014-01-10 ENCOUNTER — Encounter (HOSPITAL_COMMUNITY): Payer: Self-pay | Admitting: Emergency Medicine

## 2014-01-10 ENCOUNTER — Emergency Department (HOSPITAL_COMMUNITY): Payer: Self-pay

## 2014-01-10 ENCOUNTER — Emergency Department (HOSPITAL_COMMUNITY)
Admission: EM | Admit: 2014-01-10 | Discharge: 2014-01-10 | Disposition: A | Discharge status: Home / Self Care | Payer: Medicaid Other | Attending: Emergency Medicine | Admitting: Emergency Medicine

## 2014-01-10 DIAGNOSIS — Y9389 Activity, other specified: Secondary | ICD-10-CM | POA: Insufficient documentation

## 2014-01-10 DIAGNOSIS — Y92410 Unspecified street and highway as the place of occurrence of the external cause: Secondary | ICD-10-CM | POA: Insufficient documentation

## 2014-01-10 DIAGNOSIS — M25572 Pain in left ankle and joints of left foot: Secondary | ICD-10-CM

## 2014-01-10 DIAGNOSIS — Z8719 Personal history of other diseases of the digestive system: Secondary | ICD-10-CM | POA: Insufficient documentation

## 2014-01-10 DIAGNOSIS — Z86018 Personal history of other benign neoplasm: Secondary | ICD-10-CM | POA: Insufficient documentation

## 2014-01-10 DIAGNOSIS — Z8701 Personal history of pneumonia (recurrent): Secondary | ICD-10-CM | POA: Insufficient documentation

## 2014-01-10 DIAGNOSIS — Y998 Other external cause status: Secondary | ICD-10-CM | POA: Insufficient documentation

## 2014-01-10 DIAGNOSIS — Z862 Personal history of diseases of the blood and blood-forming organs and certain disorders involving the immune mechanism: Secondary | ICD-10-CM | POA: Insufficient documentation

## 2014-01-10 DIAGNOSIS — E669 Obesity, unspecified: Secondary | ICD-10-CM | POA: Insufficient documentation

## 2014-01-10 DIAGNOSIS — Z85841 Personal history of malignant neoplasm of brain: Secondary | ICD-10-CM | POA: Insufficient documentation

## 2014-01-10 DIAGNOSIS — S99912A Unspecified injury of left ankle, initial encounter: Secondary | ICD-10-CM | POA: Insufficient documentation

## 2014-01-10 NOTE — Discharge Instructions (Signed)
Ankle Pain Ankle pain is a common symptom. The bones, cartilage, tendons, and muscles of the ankle joint perform a lot of work each day. The ankle joint holds your body weight and allows you to move around. Ankle pain can occur on either side or back of 1 or both ankles. Ankle pain may be sharp and burning or dull and aching. There may be tenderness, stiffness, redness, or warmth around the ankle. The pain occurs more often when a person walks or puts pressure on the ankle. CAUSES  There are many reasons ankle pain can develop. It is important to work with your caregiver to identify the cause since many conditions can impact the bones, cartilage, muscles, and tendons. Causes for ankle pain include:  Injury, including a break (fracture), sprain, or strain often due to a fall, sports, or a high-impact activity.  Swelling (inflammation) of a tendon (tendonitis).  Achilles tendon rupture.  Ankle instability after repeated sprains and strains.  Poor foot alignment.  Pressure on a nerve (tarsal tunnel syndrome).  Arthritis in the ankle or the lining of the ankle.  Crystal formation in the ankle (gout or pseudogout). DIAGNOSIS  A diagnosis is based on your medical history, your symptoms, results of your physical exam, and results of diagnostic tests. Diagnostic tests may include X-ray exams or a computerized magnetic scan (magnetic resonance imaging, MRI). TREATMENT  Treatment will depend on the cause of your ankle pain and may include:  Keeping pressure off the ankle and limiting activities.  Using crutches or other walking support (a cane or brace).  Using rest, ice, compression, and elevation.  Participating in physical therapy or home exercises.  Wearing shoe inserts or special shoes.  Losing weight.  Taking medications to reduce pain or swelling or receiving an injection.  Undergoing surgery. HOME CARE INSTRUCTIONS   Only take over-the-counter or prescription medicines for  pain, discomfort, or fever as directed by your caregiver.  Put ice on the injured area.  Put ice in a plastic bag.  Place a towel between your skin and the bag.  Leave the ice on for 15-20 minutes at a time, 03-04 times a day.  Keep your leg raised (elevated) when possible to lessen swelling.  Avoid activities that cause ankle pain.  Follow specific exercises as directed by your caregiver.  Record how often you have ankle pain, the location of the pain, and what it feels like. This information may be helpful to you and your caregiver.  Ask your caregiver about returning to work or sports and whether you should drive.  Follow up with your caregiver for further examination, therapy, or testing as directed. SEEK MEDICAL CARE IF:   Pain or swelling continues or worsens beyond 1 week.  You have an oral temperature above 102 F (38.9 C).  You are feeling unwell or have chills.  You are having an increasingly difficult time with walking.  You have loss of sensation or other new symptoms.  You have questions or concerns. MAKE SURE YOU:   Understand these instructions.  Will watch your condition.  Will get help right away if you are not doing well or get worse. Document Released: 06/21/2009 Document Revised: 03/26/2011 Document Reviewed: 06/21/2009 [facility] Patient Information 2015 [place], [place]. This information is not intended to replace advice given to you by your health care provider. Make sure you discuss any questions you have with your health care provider. Cryotherapy Cryotherapy means treatment with cold. Ice or gel packs can be used to  reduce both pain and swelling. Ice is the most helpful within the first 24 to 48 hours after an injury or flare-up from overusing a muscle or joint. Sprains, strains, spasms, burning pain, shooting pain, and aches can all be eased with ice. Ice can also be used when recovering from surgery. Ice is effective, has very few side effects,  and is safe for most people to use. PRECAUTIONS  Ice is not a safe treatment option for people with:  Raynaud phenomenon. This is a condition affecting small blood vessels in the extremities. Exposure to cold may cause your problems to return.  Cold hypersensitivity. There are many forms of cold hypersensitivity, including:  Cold urticaria. Red, itchy hives appear on the skin when the tissues begin to warm after being iced.  Cold erythema. This is a red, itchy rash caused by exposure to cold.  Cold hemoglobinuria. Red blood cells break down when the tissues begin to warm after being iced. The hemoglobin that carry oxygen are passed into the urine because they cannot combine with blood proteins fast enough.  Numbness or altered sensitivity in the area being iced. If you have any of the following conditions, do not use ice until you have discussed cryotherapy with your caregiver:  Heart conditions, such as arrhythmia, angina, or chronic heart disease.  High blood pressure.  Healing wounds or open skin in the area being iced.  Current infections.  Rheumatoid arthritis.  Poor circulation.  Diabetes. Ice slows the blood flow in the region it is applied. This is beneficial when trying to stop inflamed tissues from spreading irritating chemicals to surrounding tissues. However, if you expose your skin to cold temperatures for too long or without the proper protection, you can damage your skin or nerves. Watch for signs of skin damage due to cold. HOME CARE INSTRUCTIONS Follow these tips to use ice and cold packs safely.  Place a dry or damp towel between the ice and skin. A damp towel will cool the skin more quickly, so you may need to shorten the time that the ice is used.  For a more rapid response, add gentle compression to the ice.  Ice for no more than 10 to 20 minutes at a time. The bonier the area you are icing, the less time it will take to get the benefits of ice.  Check  your skin after 5 minutes to make sure there are no signs of a poor response to cold or skin damage.  Rest 20 minutes or more between uses.  Once your skin is numb, you can end your treatment. You can test numbness by very lightly touching your skin. The touch should be so light that you do not see the skin dimple from the pressure of your fingertip. When using ice, most people will feel these normal sensations in this order: cold, burning, aching, and numbness.  Do not use ice on someone who cannot communicate their responses to pain, such as small children or people with dementia. HOW TO MAKE AN ICE PACK Ice packs are the most common way to use ice therapy. Other methods include ice massage, ice baths, and cryosprays. Muscle creams that cause a cold, tingly feeling do not offer the same benefits that ice offers and should not be used as a substitute unless recommended by your caregiver. To make an ice pack, do one of the following:  Place crushed ice or a bag of frozen vegetables in a sealable plastic bag. Squeeze out the excess  air. Place this bag inside another plastic bag. Slide the bag into a pillowcase or place a damp towel between your skin and the bag.  Mix 3 parts water with 1 part rubbing alcohol. Freeze the mixture in a sealable plastic bag. When you remove the mixture from the freezer, it will be slushy. Squeeze out the excess air. Place this bag inside another plastic bag. Slide the bag into a pillowcase or place a damp towel between your skin and the bag. SEEK MEDICAL CARE IF:  You develop white spots on your skin. This may give the skin a blotchy (mottled) appearance.  Your skin turns blue or pale.  Your skin becomes waxy or hard.  Your swelling gets worse. MAKE SURE YOU:   Understand these instructions.  Will watch your condition.  Will get help right away if you are not doing well or get worse. Document Released: 08/28/2010 Document Revised: 05/18/2013 Document  Reviewed: 08/28/2010 [facility] Patient Information 2015 [place], [place]. This information is not intended to replace advice given to you by your health care provider. Make sure you discuss any questions you have with your health care provider.
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
## 2014-02-10 ENCOUNTER — Ambulatory Visit: Payer: Medicaid Other | Admitting: Nurse Practitioner

## 2014-02-15 ENCOUNTER — Ambulatory Visit: Payer: Medicaid Other | Admitting: Nurse Practitioner

## 2014-05-18 ENCOUNTER — Ambulatory Visit: Payer: Medicaid Other | Admitting: Nurse Practitioner

## 2014-07-04 ENCOUNTER — Other Ambulatory Visit: Payer: Self-pay | Admitting: Nurse Practitioner

## 2014-07-28 ENCOUNTER — Other Ambulatory Visit: Payer: Self-pay | Admitting: Neurology

## 2014-08-02 ENCOUNTER — Telehealth: Payer: Self-pay | Admitting: Neurology

## 2014-08-02 NOTE — Telephone Encounter (Signed)
[facility],  [phone], called to have refilled cabergoline (DOSTINEX) 0.5 MG tablet

## 2014-08-12 ENCOUNTER — Encounter: Payer: Self-pay | Admitting: Family

## 2014-08-12 ENCOUNTER — Ambulatory Visit (INDEPENDENT_AMBULATORY_CARE_PROVIDER_SITE_OTHER): Payer: 59 | Admitting: Family

## 2014-08-12 VITALS — BP 126/88 | HR 90 | Temp 98.3°F | Resp 18 | Ht 65.0 in | Wt 191.4 lb

## 2014-08-12 DIAGNOSIS — R21 Rash and other nonspecific skin eruption: Secondary | ICD-10-CM | POA: Diagnosis not present

## 2014-08-12 DIAGNOSIS — D352 Benign neoplasm of pituitary gland: Secondary | ICD-10-CM

## 2014-08-12 NOTE — Progress Notes (Signed)
Subjective:    Patient ID: Kari Coffey, female    DOB: [birth year], 39 y.o.   MRN: [id number]  Chief Complaint  Patient presents with  . Establish Care    x2 days has a bite on her back, she doesn't know what kind of bite, left a mark on her back, made the underneath of her left arm get sore    HPI:  Kari Coffey is a 39 y.o. female with a PMH of GERD, pituitary macroademona, and sickle cell trait who presents today for an office visit to establish care.    1.) Bite - Associated symptom of a bite/welp located on her back has been going on for about 2 days. Is unsure of what kind of bite. Described as itchy, red and sore on occasion. Modifying factors include cleaning it with peroxide which has not had any major effects. Also described some soreness located in her axilla which she is unsure if it is related.   2.) Referral to neurology - Patient is currently under the care of neurology for her pituitary macroadenoma and is in need of referral to continue care.   Allergies  Allergen Reactions  . Peanut-Containing Drug Products Shortness Of Breath    Unknown   . Codeine     Hallucinations      Outpatient Prescriptions Prior to Visit  Medication Sig Dispense Refill  . cabergoline (DOSTINEX) 0.5 MG tablet TAKE 2 TABLETS (1 MG TOTAL) BY MOUTH 2 TIMES A WEEK ON MONDAYS AND THURSDAYS, PLEASE RESCHEDULE APPT 4 tablet 0  . diazepam (VALIUM) 5 MG tablet Take 1 tablet (5 mg total) by mouth 2 (two) times daily. 10 tablet 0  . HYDROcodone-acetaminophen (NORCO/VICODIN) 5-325 MG per tablet Take 1 tablet by mouth every 6 (six) hours as needed for moderate pain. (Patient not taking: Reported on 01/08/2014) 30 tablet 0  . ibuprofen (ADVIL,MOTRIN) 800 MG tablet Take 1 tablet (800 mg total) by mouth 3 (three) times daily. 21 tablet 0  . ondansetron (ZOFRAN ODT) 4 MG disintegrating tablet Take 1 tablet (4 mg total) by mouth every 8 (eight) hours as needed for nausea or vomiting. (Patient not taking:  Reported on 01/08/2014) 20 tablet 0   No facility-administered medications prior to visit.     Past Medical History  Diagnosis Date  . Brain tumor   . Pituitary macroadenoma   . Obesity   . Pneumonia 2007  . GERD (gastroesophageal reflux disease)     uses Zofran  . Headache(784.0)     when pituary tumor was present  . Sickle cell trait      Past Surgical History  Procedure Laterality Date  . Transspphenoidal hypophysectomy  08/04/09    via transnasal apporach  . Cholecystectomy N/A 10/19/2013    Procedure: LAPAROSCOPIC CHOLECYSTECTOMY ;  Surgeon: Doreen Salvage, MD;  Location: [facility] OR;  Service: General;  Laterality: N/A;     Family History  Problem Relation Age of Onset  . Healthy Father   . Healthy Sister   . Healthy Brother   . Healthy Sister   . Healthy Brother   . Healthy Brother   . Healthy Brother   . Heart disease Mother   . Heart failure Mother   . Healthy Maternal Grandfather   . Healthy Paternal Grandmother   . Healthy Paternal Grandfather      History   Social History  . Marital Status: Single    Spouse Name: N/A  . Number of Children: 1  . Years of  Education: 16   Occupational History  . Bookkeeping    Social History Main Topics  . Smoking status: Never Smoker   . Smokeless tobacco: Never Used  . Alcohol Use: No  . Drug Use: No  . Sexual Activity: Not on file   Other Topics Concern  . Not on file   Social History Narrative   Patient lives at home with son.   Patient has one child.   Patient is right handed.   Patient has her BA.    Patient is single.   Denies abuse and feels safe at home.   Fun: Play tennis, fishing   Denies religious beliefs effecting health care.     Review of Systems  Constitutional: Negative for fever and chills.  Musculoskeletal: Negative for neck stiffness.  Skin: Negative for rash.  Neurological: Negative for headaches.      Objective:    BP 126/88 mmHg  Pulse 90  Temp(Src) 98.3 F (36.8 C) (Oral)   Resp 18  Ht 5\' 5"  (1.651 m)  Wt 191 lb 6.4 oz (86.818 kg)  BMI 31.85 kg/m2  SpO2 95% Nursing note and vital signs reviewed.  Physical Exam  Constitutional: She is oriented to person, place, and time. She appears well-developed and well-nourished. No distress.  Cardiovascular: Normal rate, regular rhythm, normal heart sounds and intact distal pulses.   Pulmonary/Chest: Effort normal and breath sounds normal.  Neurological: She is alert and oriented to person, place, and time.  Skin: Skin is warm and dry.  3 mm, well circumscribed area with vesicular center, firm and mildly tender to the touch.  Located on the left center of her mid back just below scapula.   Psychiatric: She has a normal mood and affect. Her behavior is normal. Judgment and thought content normal.       Assessment & Plan:   Problem List Items Addressed This Visit      Endocrine   Pituitary macroadenoma    Stable with current dosage of cabergoline. Referral to neurology placed for continuity of care.       Relevant Orders   Ambulatory referral to Neurology     Musculoskeletal and Integument   Rash and nonspecific skin eruption - Primary    Question possible bite that was scratched versus start of a boil. Appears to be healing well. Continue conservative treatment with keeping the area clean and dry. Follow up for any signs of infection. Axillary soreness may be related to her cessation of cabergoline for 1 week secondary to a miscommunication.

## 2014-08-12 NOTE — Assessment & Plan Note (Signed)
Question possible bite that was scratched versus start of a boil. Appears to be healing well. Continue conservative treatment with keeping the area clean and dry. Follow up for any signs of infection. Axillary soreness may be related to her cessation of cabergoline for 1 week secondary to a miscommunication.

## 2014-08-12 NOTE — Progress Notes (Signed)
Pre visit review using our clinic review tool, if applicable. No additional management support is needed unless otherwise documented below in the visit note. 

## 2014-08-12 NOTE — Assessment & Plan Note (Signed)
Stable with current dosage of cabergoline. Referral to neurology placed for continuity of care.

## 2014-08-12 NOTE — Patient Instructions (Addendum)
Thank you for choosing [facility].  Summary/Instructions:  Please schedule a time for your physical at your convenience.   Keep clean and dry with soap water.   If your symptoms worsen or fail to improve, please contact our office for further instruction, or in case of emergency go directly to the emergency room at the closest medical facility.

## 2014-08-17 ENCOUNTER — Ambulatory Visit (INDEPENDENT_AMBULATORY_CARE_PROVIDER_SITE_OTHER): Payer: 59 | Admitting: Nurse Practitioner

## 2014-08-17 ENCOUNTER — Encounter: Payer: Self-pay | Admitting: Nurse Practitioner

## 2014-08-17 ENCOUNTER — Telehealth: Payer: Self-pay | Admitting: *Deleted

## 2014-08-17 VITALS — BP 126/82 | HR 90 | Ht 64.5 in | Wt 191.6 lb

## 2014-08-17 DIAGNOSIS — Z5181 Encounter for therapeutic drug level monitoring: Secondary | ICD-10-CM

## 2014-08-17 DIAGNOSIS — D352 Benign neoplasm of pituitary gland: Secondary | ICD-10-CM | POA: Diagnosis not present

## 2014-08-17 MED ORDER — CABERGOLINE 0.5 MG PO TABS: ORAL_TABLET | ORAL | Status: DC

## 2014-08-17 NOTE — Telephone Encounter (Signed)
Kari Coffey called back and referral # given.  Placed on her appt notes.   ([id number] 08-12-14, exp 02-12-15 good for 6 visits).  This information was also given to pt.

## 2014-08-17 NOTE — Progress Notes (Signed)
I have read the note, and I agree with the clinical assessment and plan.  WILLIS,CHARLES KEITH   

## 2014-08-17 NOTE — Patient Instructions (Signed)
Will check MRI of the brain in December 2016 Check Prolactin level Refill Cabergoline for 1 year F/U yearly

## 2014-08-17 NOTE — Progress Notes (Signed)
[facility]  PATIENT: Kari Coffey DOB: 18-Jun-1975   REASON FOR VISIT: follow up for pituitary macroadenoma HISTORY FROM: Patient    HISTORY OF PRESENT ILLNESS:[name], 39 year old black female returns for follow up. 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 Her last prolactin level 08/10/13 was 102. She remains on Cabergoline twice weekly. Last MRI of the brain 04/01/13 abnormal but her mass is stable and slightly decreased in size as compared to MRI from 2013. She needs repeat MRI in December 2016 before her insurance changes. She returns for reevaluation. She has no new complaints today . She has missed several doses of her Cabergoline recently.  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 Her last prolactin level in 04/10/12 was elevated at 120.6., previously been 238.3. Patient is on Cabergoline 0.5 mg 2 tablets twice weekly. She denies any headache or visual field changes since last seen. She had prior surgical resection of the macroadenoma. Last MRI of the brain showed decrease in mass size of the pituitary 2/7/ 2013. 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 She is exercising and eating healthy and states she feels better than she has a long time    REVIEW OF SYSTEMS: Full 14 system review of systems performed and notable only for those listed, all others are neg:  Constitutional: neg  Cardiovascular: neg Ear/Nose/Throat: neg  Skin: neg Eyes: neg Respiratory: neg Gastroitestinal: neg  Hematology/Lymphatic: neg  Endocrine: neg Musculoskeletal:neg Allergy/Immunology: neg Neurological: neg Psychiatric: neg Sleep : neg   ALLERGIES: Allergies  Allergen Reactions  . Peanut-Containing Drug Products Shortness Of Breath    Unknown   . Codeine     Hallucinations     HOME MEDICATIONS: Outpatient Prescriptions Prior to Visit  Medication Sig Dispense Refill  . cabergoline  (DOSTINEX) 0.5 MG tablet TAKE 2 TABLETS (1 MG TOTAL) BY MOUTH 2 TIMES A WEEK ON MONDAYS AND THURSDAYS, PLEASE RESCHEDULE APPT 4 tablet 0   No facility-administered medications prior to visit.    PAST MEDICAL HISTORY: Past Medical History  Diagnosis Date  . Brain tumor   . Pituitary macroadenoma   . Obesity   . Pneumonia 2007  . GERD (gastroesophageal reflux disease)     uses Zofran  . Headache(784.0)     when pituary tumor was present  . Sickle cell trait     PAST SURGICAL HISTORY: Past Surgical History  Procedure Laterality Date  . Transspphenoidal hypophysectomy  08/04/09    via transnasal apporach  . Cholecystectomy N/A 10/19/2013    Procedure: LAPAROSCOPIC CHOLECYSTECTOMY ;  Surgeon: Doreen Salvage, MD;  Location: [facility] OR;  Service: General;  Laterality: N/A;    FAMILY HISTORY: Family History  Problem Relation Age of Onset  . Healthy Father   . Healthy Sister   . Healthy Brother   . Healthy Sister   . Healthy Brother   . Healthy Brother   . Healthy Brother   . Heart disease Mother   . Heart failure Mother   . Healthy Maternal Grandfather   . Healthy Paternal Grandmother   . Healthy Paternal Grandfather     SOCIAL HISTORY: History   Social History  . Marital Status: Single    Spouse Name: N/A  . Number of Children: 1  . Years of Education: 16   Occupational History  . Bookkeeping    Social History Main Topics  . Smoking status: Never Smoker   . Smokeless tobacco: Never  Used  . Alcohol Use: No  . Drug Use: No  . Sexual Activity: Not on file   Other Topics Concern  . Not on file   Social History Narrative   Patient lives at home with son.   Patient has one child.   Patient is right handed.   Patient has her BA.    Patient is single.   Denies abuse and feels safe at home.   Fun: Play tennis, fishing   Denies religious beliefs effecting health care.      PHYSICAL EXAM  Filed Vitals:   08/17/14 0806  BP: 126/82  Pulse: 90  Height: 5' 4.5"  (1.638 m)  Weight: 191 lb 9.6 oz (86.909 kg)   Body mass index is 32.39 kg/(m^2). Generalized: Well developed, in no acute distress  Head: normocephalic and atraumatic,. Oropharynx benign  Neck: Supple, no carotid bruits  Cardiac: Regular rate rhythm, no murmur  Musculoskeletal: No deformity  Neurological examination  Mentation: Alert oriented to time, place, history taking. Follows all commands speech and language fluent  Cranial nerve II-XII: Fundoscopic exam reveals sharp disc margins. Pupils were equal round reactive to light extraocular movements were full, visual field were full on confrontational test. Facial sensation and strength were normal. Hearing was intact to finger rubbing bilaterally. Uvula tongue midline. Head turning and shoulder shrug were normal and symmetric.Tongue protrusion into cheek strength was normal.  Motor: normal bulk and tone, full strength in the BUE, BLE, fine finger movements normal, no pronator drift. No focal weakness  Sensory: normal and symmetric to light touch, pinprick, and vibration  Coordination: finger-nose-finger, heel-to-shin bilaterally, no dysmetria  Reflexes: 1+ upper lower and symmetric  Gait and Station: Rising up from seated position without assistance, normal stance, moderate stride, good arm swing, smooth turning, able to perform tiptoe, and heel walking without difficulty. Tandem gait is steady    DIAGNOSTIC DATA (LABS, IMAGING, TESTING) - I reviewed patient records, labs, notes, testing and imaging myself where available.  Lab Results  Component Value Date   WBC 4.4 10/09/2013   HGB 11.4* 10/09/2013   HCT 34.8* 10/09/2013   MCV 73.9* 10/09/2013   PLT 290 10/09/2013      Component Value Date/Time   NA 140 10/09/2013 1439   NA 144 02/09/2013 1425   K 4.1 10/09/2013 1439   CL 103 10/09/2013 1439   CO2 26 10/09/2013 1439   GLUCOSE 85 10/09/2013 1439   GLUCOSE 82 02/09/2013 1425   BUN 6 10/09/2013 1439   BUN 8  02/09/2013 1425   CREATININE 0.70 10/09/2013 1439   CALCIUM 9.2 10/09/2013 1439   PROT 7.4 10/09/2013 1439   ALBUMIN 3.8 10/09/2013 1439   AST 14 10/09/2013 1439   ALT 7 10/09/2013 1439   ALKPHOS 84 10/09/2013 1439   BILITOT <0.2* 10/09/2013 1439   GFRNONAA >90 10/09/2013 1439   GFRAA >90 10/09/2013 1439      ASSESSMENT AND PLAN  39 y.o. year old female  has a past medical history of Brain tumor; Pituitary macroadenoma; Obesity; here to follow-up. MRI of the brain March 2015 was stable from previous in 2013. She needs repeat MRI in December 2016. Last prolactin level 102 on 08/10/2013.  Check Prolactin level today Refill Cabergoline for 1 year F/U yearly  Dennie Bible, [facility], [facility], APRN  [facility] [address] [place], [place] [address] [phone]

## 2014-08-17 NOTE — Telephone Encounter (Signed)
Pt is here for appt.  Placed a call to [facility] the coordinator at [facility] , pcp, to give [place] a call back for reference #.  If she calls she may leave this with [place].

## 2014-08-18 ENCOUNTER — Telehealth: Payer: Self-pay | Admitting: Nurse Practitioner

## 2014-08-18 LAB — PROLACTIN: PROLACTIN: 256.7 ng/mL — AB (ref 4.8–23.3)

## 2014-08-18 NOTE — Telephone Encounter (Signed)
Discussed increased level of prolactin 256.7  with Dr. Jannifer Franklin. Level last year 102. Please get back on the medication will repeat level in 2 months and have follow up OV in 6 months.

## 2014-08-18 NOTE — Telephone Encounter (Signed)
Patient called stating Kari Coffey had left a message regarding her prolactin levels. She states she has been off the cabergoline (DOSTINEX) 0.5 MG tablet for about 1 month. If you need to talk with her further she can be reached at [phone] .

## 2014-08-19 ENCOUNTER — Telehealth: Payer: Self-pay | Admitting: *Deleted

## 2014-08-19 NOTE — Telephone Encounter (Signed)
I called and LMVM for pt to return call and schedule appt for her in 6 mon from last seen vs one yr qppt.  (when she does call, cancel the one yr appt 08-18-15).

## 2014-08-23 ENCOUNTER — Ambulatory Visit (INDEPENDENT_AMBULATORY_CARE_PROVIDER_SITE_OTHER): Payer: 59 | Admitting: Family

## 2014-08-23 ENCOUNTER — Encounter: Payer: Self-pay | Admitting: Family

## 2014-08-23 ENCOUNTER — Encounter: Payer: Self-pay | Admitting: *Deleted

## 2014-08-23 ENCOUNTER — Other Ambulatory Visit (INDEPENDENT_AMBULATORY_CARE_PROVIDER_SITE_OTHER): Payer: 59

## 2014-08-23 VITALS — BP 132/84 | HR 81 | Temp 98.5°F | Resp 18 | Ht 64.0 in | Wt 192.0 lb

## 2014-08-23 DIAGNOSIS — Z0000 Encounter for general adult medical examination without abnormal findings: Secondary | ICD-10-CM | POA: Diagnosis not present

## 2014-08-23 LAB — CBC
HEMATOCRIT: 35.5 % — AB (ref 36.0–46.0)
Hemoglobin: 11.6 g/dL — ABNORMAL LOW (ref 12.0–15.0)
MCHC: 32.6 g/dL (ref 30.0–36.0)
MCV: 73.2 fl — ABNORMAL LOW (ref 78.0–100.0)
PLATELETS: 288 10*3/uL (ref 150.0–400.0)
RBC: 4.84 Mil/uL (ref 3.87–5.11)
RDW: 14.4 % (ref 11.5–15.5)
WBC: 5.1 10*3/uL (ref 4.0–10.5)

## 2014-08-23 LAB — BASIC METABOLIC PANEL
BUN: 7 mg/dL (ref 6–23)
CHLORIDE: 104 meq/L (ref 96–112)
CO2: 26 mEq/L (ref 19–32)
Calcium: 9.1 mg/dL (ref 8.4–10.5)
Creatinine, Ser: 0.85 mg/dL (ref 0.40–1.20)
GFR: 96.8 mL/min (ref >60.00)
Glucose, Bld: 101 mg/dL — ABNORMAL HIGH (ref 70–99)
Potassium: 3.8 mEq/L (ref 3.5–5.1)
Sodium: 139 mEq/L (ref 135–145)

## 2014-08-23 LAB — LIPID PANEL
Cholesterol: 153 mg/dL (ref 0–200)
HDL: 31.9 mg/dL — ABNORMAL LOW (ref >39.00)
LDL Cholesterol: 102 mg/dL — ABNORMAL HIGH (ref 0–99)
NONHDL: 121.42
Total CHOL/HDL Ratio: 5
Triglycerides: 98 mg/dL (ref 0.0–149.0)
VLDL: 19.6 mg/dL (ref 0.0–40.0)

## 2014-08-23 LAB — B12 AND FOLATE PANEL
Folate: 14.2 ng/mL (ref >5.9)
Vitamin B-12: 499 pg/mL (ref 211–911)

## 2014-08-23 LAB — TSH: TSH: 3.65 u[IU]/mL (ref 0.35–4.50)

## 2014-08-23 NOTE — Assessment & Plan Note (Signed)
1) Anticipatory Guidance: Discussed importance of wearing a seatbelt while driving and not texting while driving; changing batteries in smoke detector at least once annually; wearing suntan lotion when outside; eating a balanced and moderate diet; getting physical activity at least 30 minutes per day.  2) Immunizations / Screenings / Labs:  All immunizations are up-to-date per recommendations. All screenings are up-to-date per recommendations. Obtain CBC, BMET, B12/folate, vitamin D, Lipid profile and TSH.   Overall well exam. Patient has minimal risk factors for cardiovascular disease with the exception of obesity and sedentary lifestyle. Discussed importance of nutrient dense diet and increasing physical activity to 30 minutes most days of the week. Goal would be to lose approximately 5-10% of current body weight. Follow up prevention exam in 1 year. Follow-up office visit pending lab work.

## 2014-08-23 NOTE — Patient Instructions (Addendum)
Thank you for choosing [facility].  Summary/Instructions:  Your prescription(s) have been submitted to your pharmacy or been printed and provided for you. Please take as directed and contact our office if you believe you are having problem(s) with the medication(s) or have any questions.  Please stop by the lab on the basement level of the building for your blood work. Your results will be released to MyChart (or called to you) after review, usually within 72 hours after test completion. If any changes need to be made, you will be notified at that same time.  Health Maintenance Adopting a healthy lifestyle and getting preventive care can go a long way to promote health and wellness. Talk with your health care provider about what schedule of regular examinations is right for you. This is a good chance for you to check in with your provider about disease prevention and staying healthy. In between checkups, there are plenty of things you can do on your own. Experts have done a lot of research about which lifestyle changes and preventive measures are most likely to keep you healthy. Ask your health care provider for more information. WEIGHT AND DIET  Eat a healthy diet  Be sure to include plenty of vegetables, fruits, low-fat dairy products, and lean protein.  Do not eat a lot of foods high in solid fats, added sugars, or salt.  Get regular exercise. This is one of the most important things you can do for your health.  Most adults should exercise for at least 150 minutes each week. The exercise should increase your heart rate and make you sweat (moderate-intensity exercise).  Most adults should also do strengthening exercises at least twice a week. This is in addition to the moderate-intensity exercise.  Maintain a healthy weight  Body mass index (BMI) is a measurement that can be used to identify possible weight problems. It estimates body fat based on height and weight. Your health  care provider can help determine your BMI and help you achieve or maintain a healthy weight.  For females 20 years of age and older:   A BMI below 18.5 is considered underweight.  A BMI of 18.5 to 24.9 is normal.  A BMI of 25 to 29.9 is considered overweight.  A BMI of 30 and above is considered obese.  Watch levels of cholesterol and blood lipids  You should start having your blood tested for lipids and cholesterol at 37 years of age, then have this test every 5 years.  You may need to have your cholesterol levels checked more often if:  Your lipid or cholesterol levels are high.  You are older than 37 years of age.  You are at high risk for heart disease.  CANCER SCREENING   Lung Cancer  Lung cancer screening is recommended for adults 55-80 years old who are at high risk for lung cancer because of a history of smoking.  A yearly low-dose CT scan of the lungs is recommended for people who:  Currently smoke.  Have quit within the past 15 years.  Have at least a 30-pack-year history of smoking. A pack year is smoking an average of one pack of cigarettes a day for 1 year.  Yearly screening should continue until it has been 15 years since you quit.  Yearly screening should stop if you develop a health problem that would prevent you from having lung cancer treatment.  Breast Cancer  Practice breast self-awareness. This means understanding how your breasts normally appear   and feel.  It also means doing regular breast self-exams. Let your health care provider know about any changes, no matter how small.  If you are in your 20s or 30s, you should have a clinical breast exam (CBE) by a health care provider every 1-3 years as part of a regular health exam.  If you are 40 or older, have a CBE every year. Also consider having a breast X-ray (mammogram) every year.  If you have a family history of breast cancer, talk to your health care provider about genetic  screening.  If you are at high risk for breast cancer, talk to your health care provider about having an MRI and a mammogram every year.  Breast cancer gene (BRCA) assessment is recommended for women who have family members with BRCA-related cancers. BRCA-related cancers include:  Breast.  Ovarian.  Tubal.  Peritoneal cancers.  Results of the assessment will determine the need for genetic counseling and BRCA1 and BRCA2 testing. Cervical Cancer Routine pelvic examinations to screen for cervical cancer are no longer recommended for nonpregnant women who are considered low risk for cancer of the pelvic organs (ovaries, uterus, and vagina) and who do not have symptoms. A pelvic examination may be necessary if you have symptoms including those associated with pelvic infections. Ask your health care provider if a screening pelvic exam is right for you.   The Pap test is the screening test for cervical cancer for women who are considered at risk.  If you had a hysterectomy for a problem that was not cancer or a condition that could lead to cancer, then you no longer need Pap tests.  If you are older than 65 years, and you have had normal Pap tests for the past 10 years, you no longer need to have Pap tests.  If you have had past treatment for cervical cancer or a condition that could lead to cancer, you need Pap tests and screening for cancer for at least 20 years after your treatment.  If you no longer get a Pap test, assess your risk factors if they change (such as having a new sexual partner). This can affect whether you should start being screened again.  Some women have medical problems that increase their chance of getting cervical cancer. If this is the case for you, your health care provider may recommend more frequent screening and Pap tests.  The human papillomavirus (HPV) test is another test that may be used for cervical cancer screening. The HPV test looks for the virus that can  cause cell changes in the cervix. The cells collected during the Pap test can be tested for HPV.  The HPV test can be used to screen women 30 years of age and older. Getting tested for HPV can extend the interval between normal Pap tests from three to five years.  An HPV test also should be used to screen women of any age who have unclear Pap test results.  After 37 years of age, women should have HPV testing as often as Pap tests.  Colorectal Cancer  This type of cancer can be detected and often prevented.  Routine colorectal cancer screening usually begins at 37 years of age and continues through 37 years of age.  Your health care provider may recommend screening at an earlier age if you have risk factors for colon cancer.  Your health care provider may also recommend using home test kits to check for hidden blood in the stool.  A small   camera at the end of a tube can be used to examine your colon directly (sigmoidoscopy or colonoscopy). This is done to check for the earliest forms of colorectal cancer.  Routine screening usually begins at age 47.  Direct examination of the colon should be repeated every 5-10 years through 37 years of age. However, you may need to be screened more often if early forms of precancerous polyps or small growths are found. Skin Cancer  Check your skin from head to toe regularly.  Tell your health care provider about any new moles or changes in moles, especially if there is a change in a mole's shape or color.  Also tell your health care provider if you have a mole that is larger than the size of a pencil eraser.  Always use sunscreen. Apply sunscreen liberally and repeatedly throughout the day.  Protect yourself by wearing long sleeves, pants, a wide-brimmed hat, and sunglasses whenever you are outside. HEART DISEASE, DIABETES, AND HIGH BLOOD PRESSURE   Have your blood pressure checked at least every 1-2 years. High blood pressure causes heart  disease and increases the risk of stroke.  If you are between 61 years and 67 years old, ask your health care provider if you should take aspirin to prevent strokes.  Have regular diabetes screenings. This involves taking a blood sample to check your fasting blood sugar level.  If you are at a normal weight and have a low risk for diabetes, have this test once every three years after 37 years of age.  If you are overweight and have a high risk for diabetes, consider being tested at a younger age or more often. PREVENTING INFECTION  Hepatitis B  If you have a higher risk for hepatitis B, you should be screened for this virus. You are considered at high risk for hepatitis B if:  You were born in a country where hepatitis B is common. Ask your health care provider which countries are considered high risk.  Your parents were born in a high-risk country, and you have not been immunized against hepatitis B (hepatitis B vaccine).  You have HIV or AIDS.  You use needles to inject street drugs.  You live with someone who has hepatitis B.  You have had sex with someone who has hepatitis B.  You get hemodialysis treatment.  You take certain medicines for conditions, including cancer, organ transplantation, and autoimmune conditions. Hepatitis C  Blood testing is recommended for:  Everyone born from [birth year] through 1963.  Anyone with known risk factors for hepatitis C. Sexually transmitted infections (STIs)  You should be screened for sexually transmitted infections (STIs) including gonorrhea and chlamydia if:  You are sexually active and are younger than 37 years of age.  You are older than 37 years of age and your health care provider tells you that you are at risk for this type of infection.  Your sexual activity has changed since you were last screened and you are at an increased risk for chlamydia or gonorrhea. Ask your health care provider if you are at risk.  If you do not have  HIV, but are at risk, it may be recommended that you take a prescription medicine daily to prevent HIV infection. This is called pre-exposure prophylaxis (PrEP). You are considered at risk if:  You are sexually active and do not regularly use condoms or know the HIV status of your partner(s).  You take drugs by injection.  You are sexually active with a  partner who has HIV. Talk with your health care provider about whether you are at high risk of being infected with HIV. If you choose to begin PrEP, you should first be tested for HIV. You should then be tested every 3 months for as long as you are taking PrEP.  PREGNANCY   If you are premenopausal and you may become pregnant, ask your health care provider about preconception counseling.  If you may become pregnant, take 400 to 800 micrograms (mcg) of folic acid every day.  If you want to prevent pregnancy, talk to your health care provider about birth control (contraception). OSTEOPOROSIS AND MENOPAUSE   Osteoporosis is a disease in which the bones lose minerals and strength with aging. This can result in serious bone fractures. Your risk for osteoporosis can be identified using a bone density scan.  If you are 65 years of age or older, or if you are at risk for osteoporosis and fractures, ask your health care provider if you should be screened.  Ask your health care provider whether you should take a calcium or vitamin D supplement to lower your risk for osteoporosis.  Menopause may have certain physical symptoms and risks.  Hormone replacement therapy may reduce some of these symptoms and risks. Talk to your health care provider about whether hormone replacement therapy is right for you.  HOME CARE INSTRUCTIONS   Schedule regular health, dental, and eye exams.  Stay current with your immunizations.   Do not use any tobacco products including cigarettes, chewing tobacco, or electronic cigarettes.  If you are pregnant, do not  drink alcohol.  If you are breastfeeding, limit how much and how often you drink alcohol.  Limit alcohol intake to no more than 1 drink per day for nonpregnant women. One drink equals 12 ounces of beer, 5 ounces of wine, or 1 ounces of hard liquor.  Do not use street drugs.  Do not share needles.  Ask your health care provider for help if you need support or information about quitting drugs.  Tell your health care provider if you often feel depressed.  Tell your health care provider if you have ever been abused or do not feel safe at home. Document Released: 07/17/2010 Document Revised: 05/18/2013 Document Reviewed: 12/03/2012 [employer] Patient Information 2015 [employer]. This information is not intended to replace advice given to you by your health care provider. Make sure you discuss any questions you have with your health care provider.   

## 2014-08-23 NOTE — Telephone Encounter (Signed)
I sent letter regarding her 6 mo appt that I made and cancelling her August 18, 2015 appt.

## 2014-08-23 NOTE — Progress Notes (Signed)
Pre visit review using our clinic review tool, if applicable. No additional management support is needed unless otherwise documented below in the visit note. 

## 2014-08-23 NOTE — Telephone Encounter (Signed)
LMVM for pt again re: needing to change from yearly appt to 6 mo RV.

## 2014-08-23 NOTE — Progress Notes (Signed)
Subjective:    Patient ID: Kari Coffey, female    DOB: 05/18/1975, 39 y.o.   MRN: [id number]  Chief Complaint  Patient presents with  . CPE    fasting    HPI:  Kari Coffey is a 39 y.o. female who presents today for an annual wellness visit.   1) Health Maintenance -   Diet - Averages about 5 small meals per day consisting of dairy, fruits, vegetables, beef, fish and chicken; 1-2 cups of caffeine daily   Exercise - No structured exercise, however going to the gym starting today.    2) Preventative Exams / Immunizations:  Dental -- Up to date   Vision -- Up to date    Health Maintenance  Topic Date Due  . HIV Screening  09/05/1992  . INFLUENZA VACCINE  08/16/2014  . PAP SMEAR  02/17/2016  . TETANUS/TDAP  02/22/2019     There is no immunization history on file for this patient.  Allergies  Allergen Reactions  . Peanut-Containing Drug Products Shortness Of Breath    Unknown   . Codeine     Hallucinations      Outpatient Prescriptions Prior to Visit  Medication Sig Dispense Refill  . cabergoline (DOSTINEX) 0.5 MG tablet TAKE 2 TABLETS (1 MG TOTAL) BY MOUTH 2 TIMES A WEEK ON MONDAYS AND THURSDAYS, 20 tablet 11   No facility-administered medications prior to visit.     Past Medical History  Diagnosis Date  . Brain tumor   . Pituitary macroadenoma   . Obesity   . Pneumonia 2007  . GERD (gastroesophageal reflux disease)     uses Zofran  . Headache(784.0)     when pituary tumor was present  . Sickle cell trait      Past Surgical History  Procedure Laterality Date  . Transspphenoidal hypophysectomy  08/04/09    via transnasal apporach  . Cholecystectomy N/A 10/19/2013    Procedure: LAPAROSCOPIC CHOLECYSTECTOMY ;  Surgeon: Doreen Salvage, MD;  Location: [facility] OR;  Service: General;  Laterality: N/A;     Family History  Problem Relation Age of Onset  . Healthy Father   . Healthy Sister   . Healthy Brother   . Healthy Sister   . Healthy Brother   .  Healthy Brother   . Healthy Brother   . Heart disease Mother   . Heart failure Mother   . Healthy Maternal Grandfather   . Healthy Paternal Grandmother   . Healthy Paternal Grandfather      History   Social History  . Marital Status: Single    Spouse Name: N/A  . Number of Children: 1  . Years of Education: 16   Occupational History  . Bookkeeping    Social History Main Topics  . Smoking status: Never Smoker   . Smokeless tobacco: Never Used  . Alcohol Use: No  . Drug Use: No  . Sexual Activity: Not on file   Other Topics Concern  . Not on file   Social History Narrative   Patient lives at home with son.   Patient has one child.   Patient is right handed.   Patient has her BA.    Patient is single.   Denies abuse and feels safe at home.   Fun: Play tennis, fishing   Denies religious beliefs effecting health care.     Review of Systems  Constitutional: Denies fever, chills, fatigue, or significant weight gain/loss. HENT: Head: Denies headache or neck pain Ears: Denies  changes in hearing, ringing in ears, earache, drainage Nose: Denies discharge, stuffiness, itching, nosebleed, sinus pain Throat: Denies sore throat, hoarseness, dry mouth, sores, thrush Eyes: Denies loss/changes in vision, pain, redness, blurry/double vision, flashing lights Cardiovascular: Denies chest pain/discomfort, tightness, palpitations, shortness of breath with activity, difficulty lying down, swelling, sudden awakening with shortness of breath Respiratory: Denies shortness of breath, cough, sputum production, wheezing Gastrointestinal: Denies dysphasia, heartburn, change in appetite, nausea, change in bowel habits, rectal bleeding, constipation, diarrhea, yellow skin or eyes Genitourinary: Denies frequency, urgency, burning/pain, blood in urine, incontinence, change in urinary strength. Musculoskeletal: Denies muscle/joint pain, stiffness, back pain, redness or swelling of joints,  trauma Skin: Denies rashes, lumps, itching, dryness, color changes, or hair/nail changes Neurological: Denies dizziness, fainting, seizures, weakness, numbness, tingling, tremor Psychiatric - Denies nervousness, stress, depression or memory loss Endocrine: Denies heat or cold intolerance, sweating, frequent urination, excessive thirst, changes in appetite Hematologic: Denies ease of bruising or bleeding     Objective:     BP 132/84 mmHg  Pulse 81  Temp(Src) 98.5 F (36.9 C) (Oral)  Resp 18  Ht 5\' 4"  (1.626 m)  Wt 192 lb (87.091 kg)  BMI 32.94 kg/m2  SpO2 98% Nursing note and vital signs reviewed.  Physical Exam  Constitutional: She is oriented to person, place, and time. She appears well-developed and well-nourished.  HENT:  Head: Normocephalic.  Right Ear: Hearing, tympanic membrane, external ear and ear canal normal.  Left Ear: Hearing, tympanic membrane, external ear and ear canal normal.  Nose: Nose normal.  Mouth/Throat: Uvula is midline, oropharynx is clear and moist and mucous membranes are normal.  Eyes: Conjunctivae and EOM are normal. Pupils are equal, round, and reactive to light.  Neck: Neck supple. No JVD present. No tracheal deviation present. No thyromegaly present.  Cardiovascular: Normal rate, regular rhythm, normal heart sounds and intact distal pulses.   Pulmonary/Chest: Effort normal and breath sounds normal.  Abdominal: Soft. Bowel sounds are normal. She exhibits no distension and no mass. There is no tenderness. There is no rebound and no guarding.  Musculoskeletal: Normal range of motion. She exhibits no edema or tenderness.  Lymphadenopathy:    She has no cervical adenopathy.  Neurological: She is alert and oriented to person, place, and time. She has normal reflexes. No cranial nerve deficit. She exhibits normal muscle tone. Coordination normal.  Skin: Skin is warm and dry.  Psychiatric: She has a normal mood and affect. Her behavior is normal.  Judgment and thought content normal.       Assessment & Plan:   Problem List Items Addressed This Visit      Other   Routine general medical examination at a health care facility - Primary    1) Anticipatory Guidance: Discussed importance of wearing a seatbelt while driving and not texting while driving; changing batteries in smoke detector at least once annually; wearing suntan lotion when outside; eating a balanced and moderate diet; getting physical activity at least 30 minutes per day.  2) Immunizations / Screenings / Labs:  All immunizations are up-to-date per recommendations. All screenings are up-to-date per recommendations. Obtain CBC, BMET, B12/folate, vitamin D, Lipid profile and TSH.   Overall well exam. Patient has minimal risk factors for cardiovascular disease with the exception of obesity and sedentary lifestyle. Discussed importance of nutrient dense diet and increasing physical activity to 30 minutes most days of the week. Goal would be to lose approximately 5-10% of current body weight. Follow up prevention exam in 1  year. Follow-up office visit pending lab work.       Relevant Orders   Lipid panel   TSH   Basic metabolic panel   CBC   B90 and Folate Panel   Vitamin D 1,25 dihydroxy

## 2014-08-25 ENCOUNTER — Encounter: Payer: Self-pay | Admitting: Family

## 2014-08-26 ENCOUNTER — Encounter: Payer: Self-pay | Admitting: Family

## 2014-08-26 LAB — VITAMIN D 1,25 DIHYDROXY
Vitamin D 1, 25 (OH)2 Total: 42 pg/mL (ref 18–72)
Vitamin D2 1, 25 (OH)2: <8 pg/mL
Vitamin D3 1, 25 (OH)2: 42 pg/mL

## 2014-10-26 ENCOUNTER — Telehealth: Payer: Self-pay | Admitting: Nurse Practitioner

## 2014-10-26 DIAGNOSIS — D352 Benign neoplasm of pituitary gland: Secondary | ICD-10-CM

## 2014-10-26 NOTE — Telephone Encounter (Signed)
Please call patient and let her know it is time to get repeat prolactin level. Order is in the  system

## 2014-10-26 NOTE — Telephone Encounter (Signed)
LMVM for pt on home # that time for 2 mo f/u on prolactin level.  She is to call back for questions.

## 2014-10-28 ENCOUNTER — Encounter: Payer: Self-pay | Admitting: *Deleted

## 2014-10-28 NOTE — Telephone Encounter (Signed)
Called pt again but did not LM.  Will mail note relating to lab request. Done.

## 2014-11-09 ENCOUNTER — Encounter: Payer: Self-pay | Admitting: Nurse Practitioner

## 2014-11-09 NOTE — Telephone Encounter (Signed)
This encounter was created in error - please disregard.

## 2015-01-12 ENCOUNTER — Telehealth: Payer: Self-pay | Admitting: Nurse Practitioner

## 2015-01-12 NOTE — Telephone Encounter (Signed)
I called pt and LMVM for her about lab work requested.  (request copy of results if had some other facility or to have done if not).

## 2015-01-12 NOTE — Telephone Encounter (Signed)
Patient was due to get repeat prolactin level in October. I do not see that this has been done and if  it has been done at an outside lab we have not received those results. In addition she is due for repeat MRI of the brain in December. Please call patient to inform her of the above

## 2015-01-18 NOTE — Telephone Encounter (Signed)
Yes send a letter certified that way we know she got it if she is still at same address

## 2015-01-19 ENCOUNTER — Encounter: Payer: Self-pay | Admitting: *Deleted

## 2015-01-19 NOTE — Telephone Encounter (Signed)
New letter sent/certified by medical records.

## 2015-02-17 ENCOUNTER — Ambulatory Visit: Payer: Self-pay | Admitting: Nurse Practitioner

## 2015-02-18 ENCOUNTER — Encounter: Payer: Self-pay | Admitting: Nurse Practitioner

## 2015-03-30 ENCOUNTER — Telehealth: Payer: Self-pay | Admitting: Nurse Practitioner

## 2015-03-30 NOTE — Telephone Encounter (Signed)
Letter mailed

## 2015-08-18 ENCOUNTER — Ambulatory Visit: Payer: 59 | Admitting: Nurse Practitioner

## 2015-09-24 ENCOUNTER — Encounter: Payer: Self-pay | Admitting: *Deleted

## 2015-09-24 LAB — LAB REPORT - SCANNED: PAP SMEAR: NEGATIVE

## 2015-11-01 ENCOUNTER — Encounter: Payer: Self-pay | Admitting: Nurse Practitioner

## 2015-11-01 NOTE — Telephone Encounter (Signed)
This encounter was created in error - please disregard.

## 2019-02-09 ENCOUNTER — Other Ambulatory Visit: Payer: Self-pay

## 2019-02-09 ENCOUNTER — Ambulatory Visit: Payer: Self-pay | Attending: Family Medicine | Admitting: Family Medicine

## 2019-02-09 ENCOUNTER — Encounter: Payer: Self-pay | Admitting: Family Medicine

## 2019-02-09 VITALS — BP 136/86 | HR 78 | Ht 64.5 in | Wt 198.4 lb

## 2019-02-09 DIAGNOSIS — Z1159 Encounter for screening for other viral diseases: Secondary | ICD-10-CM

## 2019-02-09 DIAGNOSIS — Z13228 Encounter for screening for other metabolic disorders: Secondary | ICD-10-CM

## 2019-02-09 DIAGNOSIS — D352 Benign neoplasm of pituitary gland: Secondary | ICD-10-CM

## 2019-02-09 DIAGNOSIS — M25562 Pain in left knee: Secondary | ICD-10-CM

## 2019-02-09 DIAGNOSIS — M25561 Pain in right knee: Secondary | ICD-10-CM

## 2019-02-09 HISTORY — DX: Benign neoplasm of pituitary gland: D35.2

## 2019-02-09 MED ORDER — MELOXICAM 7.5 MG PO TABS: 7.5000 mg | ORAL_TABLET | Freq: Every day | ORAL | 1 refills | Status: DC

## 2019-02-09 NOTE — Progress Notes (Signed)
Has been working out at [employer] and now she states that her knees are swelling and painful.

## 2019-02-09 NOTE — Patient Instructions (Signed)

## 2019-02-10 ENCOUNTER — Encounter: Payer: Self-pay | Admitting: Family Medicine

## 2019-02-10 LAB — LIPID PANEL
Chol/HDL Ratio: 5.1 ratio — ABNORMAL HIGH (ref 0.0–4.4)
Cholesterol, Total: 152 mg/dL (ref 100–199)
HDL: 30 mg/dL — ABNORMAL LOW (ref >39)
LDL Chol Calc (NIH): 98 mg/dL (ref 0–99)
Triglycerides: 131 mg/dL (ref 0–149)
VLDL Cholesterol Cal: 24 mg/dL (ref 5–40)

## 2019-02-10 LAB — CMP14+EGFR
ALT: 6 IU/L (ref 0–32)
AST: 13 IU/L (ref 0–40)
Albumin/Globulin Ratio: 1.1 — ABNORMAL LOW (ref 1.2–2.2)
Albumin: 3.8 g/dL (ref 3.8–4.8)
Alkaline Phosphatase: 106 IU/L (ref 39–117)
BUN/Creatinine Ratio: 7 — ABNORMAL LOW (ref 9–23)
BUN: 5 mg/dL — ABNORMAL LOW (ref 6–24)
Bilirubin Total: <0.2 mg/dL (ref 0.0–1.2)
CO2: 24 mmol/L (ref 20–29)
Calcium: 9.3 mg/dL (ref 8.7–10.2)
Chloride: 104 mmol/L (ref 96–106)
Creatinine, Ser: 0.75 mg/dL (ref 0.57–1.00)
GFR calc Af Amer: 114 mL/min/{1.73_m2} (ref >59)
GFR calc non Af Amer: 99 mL/min/{1.73_m2} (ref >59)
Globulin, Total: 3.4 g/dL (ref 1.5–4.5)
Glucose: 91 mg/dL (ref 65–99)
Potassium: 4.3 mmol/L (ref 3.5–5.2)
Sodium: 140 mmol/L (ref 134–144)
Total Protein: 7.2 g/dL (ref 6.0–8.5)

## 2019-02-10 LAB — PROLACTIN: Prolactin: 1839 ng/mL — ABNORMAL HIGH (ref 4.8–23.3)

## 2019-02-10 LAB — HIV ANTIBODY (ROUTINE TESTING W REFLEX): HIV Screen 4th Generation wRfx: NONREACTIVE

## 2019-02-10 NOTE — Progress Notes (Signed)
 Subjective:  Patient ID: Kari Coffey, female    DOB: 11/22/1975  Age: 44 y.o. 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  Please see chart from [place], [place] - [id number] for additional history given her chart is marked for merging.  She presents complaining of pain in the bilateral superolateral aspect of both knees which started ever since she started working out a couple weeks ago and attempt to lose weight.  She noticed slight swelling but denies history of trauma.  During her workout she engaged in activities that involved flexing and extending her legs. Currently does not have any medications for pain.  Due to lack of medical coverage she never followed up with neurology (last visit 2016) and has not been on cabergoline for management of her pituitary macroadenoma.  She denies presence of headaches, blurry vision, nausea or vomiting. MRI brain from 03/2013 revealed: IMPRESSION:  Abnormal MRI brain and pituitary (with and without) demonstrating: 1. Heterogenously enhancing pituitary mass measuring 1.8x2.2x2.2cm (APxtransxSI). The mass extends anteriorly into the sphenoid sinus, laterally into the left cavernous sinus, and inferiorly into the clivus.  There is mass effect with rightward deviation of the pituitary stalk. No mass effect on the optic chiasm.  2. Compared to MRI from 02/22/11, the mass is stable to slightly decreased in size.  Past Medical History:  Diagnosis Date  . No pertinent past medical history   . 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 Diabetes Mother     No Known Allergies  No outpatient medications prior to visit.   No facility-administered medications prior to visit.     ROS Review of Systems  Constitutional: Negative for activity change,  appetite change and fatigue.  HENT: Negative for congestion, sinus pressure and sore throat.   Eyes: Negative for visual disturbance.  Respiratory: Negative for cough, chest tightness, shortness of breath and wheezing.   Cardiovascular: Negative for chest pain and palpitations.  Gastrointestinal: Negative for abdominal distention, abdominal pain and constipation.  Endocrine: Negative for polydipsia.  Genitourinary: Negative for dysuria and frequency.  Musculoskeletal: Negative for arthralgias and back pain.  Skin: Negative for rash.  Neurological: Negative for tremors, light-headedness and numbness.  Hematological: Does not bruise/bleed easily.  Psychiatric/Behavioral: Negative for agitation and behavioral problems.    Objective:  BP 136/86   Pulse 78   Ht 5' 4.5" (1.638 m)   Wt 198 lb 6.4 oz (90 kg)   SpO2 96%   BMI 33.53 kg/m   BP/Weight 02/09/2019  Systolic BP 136  Diastolic BP 86  Wt. (Lbs) 198.4  BMI 33.53      Physical Exam Constitutional:      Appearance: She is well-developed.  Neck:     Vascular: No JVD.  Cardiovascular:     Rate and Rhythm: Normal rate.     Heart sounds: Normal heart sounds. No murmur.  Pulmonary:     Effort: Pulmonary effort is normal.     Breath sounds: Normal breath sounds. No wheezing or rales.  Chest:     Chest wall: No tenderness.  Abdominal:     General: Bowel sounds are normal. There is no distension.     Palpations: Abdomen is soft. There is no mass.     Tenderness: There is no abdominal tenderness.  Musculoskeletal:          General: Normal range of motion.     Right lower leg: No edema.     Left lower leg: No edema.  Neurological:     Mental Status: She is alert and oriented to person, place, and time.  Psychiatric:        Mood and Affect: Mood normal.     CMP Latest Ref Rng & Units 02/09/2019  Glucose 65 - 99 mg/dL 91  BUN 6 - 24 mg/dL 5(L)  Creatinine 0.57 - 1.00 mg/dL 0.75  Sodium 134 - 144 mmol/L 140  Potassium  3.5 - 5.2 mmol/L 4.3  Chloride 96 - 106 mmol/L 104  CO2 20 - 29 mmol/L 24  Calcium 8.7 - 10.2 mg/dL 9.3  Total Protein 6.0 - 8.5 g/dL 7.2  Total Bilirubin 0.0 - 1.2 mg/dL <0.2  Alkaline Phos 39 - 117 IU/L 106  AST 0 - 40 IU/L 13  ALT 0 - 32 IU/L 6    Lipid Panel     Component Value Date/Time   CHOL 152 02/09/2019 1120   TRIG 131 02/09/2019 1120   HDL 30 (L) 02/09/2019 1120   CHOLHDL 5.1 (H) 02/09/2019 1120   LDLCALC 98 02/09/2019 1120    CBC No results found for: WBC, RBC, HGB, HCT, PLT, MCV, MCH, MCHC, RDW, LYMPHSABS, MONOABS, EOSABS, BASOSABS  No results found for: HGBA1C  Assessment & Plan:  1. Acute pain of both knees Advised to apply ice, use knee brace Avoid bed rest but rather engage in a graded exercise regimen and commence to slowly Weight loss will be beneficial - meloxicam (MOBIC) 7.5 MG tablet; Take 1 tablet (7.5 mg total) by mouth daily.  Dispense: 30 tablet; Refill: 1  2. Screening for metabolic disorder - FYT24+MQKM - Lipid panel  3. Screening for viral disease - HIV Antibody (routine testing w rflx)  4. Pituitary macroadenoma ([place]) Asymptomatic We will need a referral back to neurology We will check prolactin level - Prolactin   Health Care Maintenance: At next visit Meds ordered this encounter  Medications  . meloxicam (MOBIC) 7.5 MG tablet    Sig: Take 1 tablet (7.5 mg total) by mouth daily.    Dispense:  30 tablet    Refill:  1    Follow-up: Return in about 1 month (around 03/12/2019) for Complete physical exam.       Charlott Rakes, MD, FAAFP. [facility] and [place], [place]   02/10/2019, 1:10 PM

## 2019-02-12 ENCOUNTER — Encounter: Payer: Self-pay | Admitting: Family

## 2019-02-12 ENCOUNTER — Telehealth: Payer: Self-pay

## 2019-02-12 NOTE — Telephone Encounter (Signed)
-----   Message from Charlott Rakes, MD sent at 02/10/2019  1:23 PM EST ----- Please inform her kidney, liver functions are normal.  Total cholesterol is normal however HDL which is the good cholesterol is slightly low 1 can be increased by increasing physical activity.  Prolactin level is significantly elevated and I have placed a referral to neurology.  She will also need an MRI of the brain.  Can you please schedule this for her?  Thank you

## 2019-02-12 NOTE — Telephone Encounter (Signed)
Patient was called and informed of lab results. Patient was also informed of referral being placed and MRI appointment.

## 2019-03-02 ENCOUNTER — Ambulatory Visit (HOSPITAL_COMMUNITY): Admission: RE | Admit: 2019-03-02 | Payer: Self-pay | Source: Ambulatory Visit

## 2019-03-17 ENCOUNTER — Ambulatory Visit: Payer: Self-pay | Admitting: Family Medicine

## 2019-05-04 ENCOUNTER — Telehealth: Payer: Self-pay | Admitting: General Practice

## 2019-05-04 DIAGNOSIS — D352 Benign neoplasm of pituitary gland: Secondary | ICD-10-CM

## 2019-05-04 NOTE — Telephone Encounter (Signed)
Referral has been placed. 

## 2019-05-04 NOTE — Telephone Encounter (Signed)
Pt was last seen by Dr Margarita Rana on 02-09-19 but since needs a referral to be seen by  endocrinologist Donna Christen MD

## 2019-05-04 NOTE — Telephone Encounter (Signed)
States that she want referral for elevated prolactin levels, States that she use to see this doctor and would like to return.

## 2019-05-05 NOTE — Telephone Encounter (Signed)
Patient was called and informed of referral being placed to endocrin.

## 2019-06-04 ENCOUNTER — Other Ambulatory Visit: Payer: Self-pay | Admitting: Endocrinology

## 2019-06-04 DIAGNOSIS — E221 Hyperprolactinemia: Secondary | ICD-10-CM

## 2020-04-13 ENCOUNTER — Ambulatory Visit: Payer: Self-pay | Admitting: *Deleted

## 2020-04-13 NOTE — Telephone Encounter (Signed)
Pt called in c/o having a runny nose and a non productive cough that started last week.  She doesn't think she is having fever.   Does not have a thermometer.   She has not done a covid test.   I encouraged her to be tested for covid due to similar symptoms between allergies and covid.  She has tried  OTC medications without relief.  She is c/o pressure around her eyes and post nasal drip.  There were no appts availabe with any of the providers at [facility].   Protocol is to be seen within 24 hours.   I went over the care advice most of which she had already tried.  I suggested she go to an urgent care which she was agreeable to doing.   "I had thought about going on to the urgent care anyway today when I got off of work".  "There is one near me".    Reason for Disposition . [1] Sinus pain (not just congestion) AND [2] fever  Answer Assessment - Initial Assessment Questions 1. ONSET: "When did the nasal discharge start?"      For 2-3 days I'm hot and cold.  No body aches.   No fever I don't think.   I'm Vick's and sudephed being used. 2. AMOUNT: "How much discharge is there?"      I still have a runny.   Runny nose for 4 days and coughing a lot and it's hurting my head.   Sinus congestion. 3. COUGH: "Do you have a cough?" If yes, ask: "Describe the color of your sputum" (clear, white, yellow, green)     Yes   Non productive     Just coughing a lot.   It's worse at night the coughing.  I have drainage down the back of my throat.   I have allergy problems this time of year.   I tried Zyrtec and it helped some but my nose is still runny.    I don't have a thermometer.   I'm at work now.   No covid exposure.   4. RESPIRATORY DISTRESS: "Describe your breathing."      No shortness of breath 5. FEVER: "Do you have a fever?" If Yes, ask: "What is your temperature, how was it measured, and when did it start?"     Don't know   No chills 6. SEVERITY: "Overall, how bad  are you feeling right now?" (e.g., doesn't interfere with normal activities, staying home from school/work, staying in bed)      At work now.   Yesterday I missed work.   I started feeling bad last week.   7. OTHER SYMPTOMS: "Do you have any other symptoms?" (e.g., sore throat, earache, wheezing, vomiting)     No diarrhea or vomiting.   No loss of taste or smell.   8. PREGNANCY: "Is there any chance you are pregnant?" "When was your last menstrual period?"     No  Protocols used: COMMON COLD-A-AH

## 2020-04-19 ENCOUNTER — Ambulatory Visit (INDEPENDENT_AMBULATORY_CARE_PROVIDER_SITE_OTHER): Payer: 59

## 2020-04-19 ENCOUNTER — Encounter (HOSPITAL_COMMUNITY): Payer: Self-pay

## 2020-04-19 ENCOUNTER — Ambulatory Visit (HOSPITAL_COMMUNITY)
Admission: EM | Admit: 2020-04-19 | Discharge: 2020-04-19 | Disposition: A | Discharge status: Home / Self Care | Payer: 59 | Attending: Medical Oncology | Admitting: Medical Oncology

## 2020-04-19 ENCOUNTER — Other Ambulatory Visit: Payer: Self-pay

## 2020-04-19 DIAGNOSIS — R0989 Other specified symptoms and signs involving the circulatory and respiratory systems: Secondary | ICD-10-CM | POA: Diagnosis not present

## 2020-04-19 DIAGNOSIS — R059 Cough, unspecified: Secondary | ICD-10-CM | POA: Diagnosis not present

## 2020-04-19 DIAGNOSIS — Z79899 Other long term (current) drug therapy: Secondary | ICD-10-CM | POA: Insufficient documentation

## 2020-04-19 DIAGNOSIS — Z791 Long term (current) use of non-steroidal anti-inflammatories (NSAID): Secondary | ICD-10-CM | POA: Diagnosis not present

## 2020-04-19 DIAGNOSIS — Z20822 Contact with and (suspected) exposure to covid-19: Secondary | ICD-10-CM | POA: Diagnosis not present

## 2020-04-19 DIAGNOSIS — R051 Acute cough: Secondary | ICD-10-CM | POA: Insufficient documentation

## 2020-04-19 DIAGNOSIS — R0981 Nasal congestion: Secondary | ICD-10-CM

## 2020-04-19 MED ORDER — BENZONATATE 100 MG PO CAPS: 100.0000 mg | ORAL_CAPSULE | Freq: Three times a day (TID) | ORAL | 0 refills | Status: DC

## 2020-04-19 MED ORDER — FLUTICASONE PROPIONATE 50 MCG/ACT NA SUSP: 2.0000 | Freq: Every day | NASAL | 0 refills | Status: DC

## 2020-04-19 NOTE — ED Provider Notes (Signed)
Man    CSN: [id number] Arrival date & time: 04/19/20  1250      History   Chief Complaint Chief Complaint  Patient presents with  . Generalized Body Aches  . Cough  . Fever    HPI Kari Coffey is a 45 y.o. female.   HPI   Cold Symptoms: Pt reports that she has had fevers, body aches, runny nose, dry cough and sore throat for 2 weeks. She reports that the fevers stopped about 1 week ago. Since then she has mainly had body aches, nasal congestion, runny nose, dry cough and slightly sore throat. She has tried Theraflu, pseudafed without relief. NO chest pains, SOB, GI upset.   Past Medical History:  Diagnosis Date  . Brain tumor ([place])   . GERD (gastroesophageal reflux disease)    uses Zofran  . Headache(784.0)    when pituary tumor was present  . No pertinent past medical history   . Obesity   . Pituitary macroadenoma ([place])   . Pituitary macroadenoma ([place]) 02/09/2019  . Pneumonia 2007  . Sickle cell trait [facility])     Patient Active Problem List   Diagnosis Date Noted  . Pituitary macroadenoma ([place]) 02/09/2019  . Routine general medical examination at a health care facility 08/23/2014  . Rash and nonspecific skin eruption 08/12/2014  . Pituitary macroadenoma ([place]) 04/10/2012    Past Surgical History:  Procedure Laterality Date  . CHOLECYSTECTOMY N/A 10/19/2013   Procedure: LAPAROSCOPIC CHOLECYSTECTOMY ;  Surgeon: Doreen Salvage, MD;  Location: [place];  Service: General;  Laterality: N/A;  . GALLBLADDER SURGERY    . Transspphenoidal Hypophysectomy  08/04/09   via transnasal apporach    OB History   No obstetric history on file.      Home Medications    Prior to Admission medications   Medication Sig Start Date End Date Taking? Authorizing Provider  cabergoline (DOSTINEX) 0.5 MG tablet TAKE 2 TABLETS (1 MG TOTAL) BY MOUTH 2 TIMES A WEEK ON MONDAYS AND THURSDAYS, 08/17/14   Dennie Bible, NP  meloxicam (MOBIC) 7.5 MG tablet Take 1 tablet  (7.5 mg total) by mouth daily. 02/09/19   Charlott Rakes, MD    Family History Family History  Problem Relation Age of Onset  . Diabetes Mother   . Healthy Father   . Healthy Sister   . Healthy Brother   . Healthy Sister   . Healthy Brother   . Healthy Brother   . Healthy Brother   . Heart disease Mother   . Heart failure Mother   . Healthy Maternal Grandfather   . Healthy Paternal Grandmother   . Healthy Paternal Grandfather     Social History Social History   Tobacco Use  . Smoking status: Never Smoker  . Smokeless tobacco: Never Used  Substance Use Topics  . Alcohol use: Never  . Drug use: Never     Allergies   Peanut-containing drug products and Codeine   Review of Systems Review of Systems  As stated above in HPI Physical Exam Triage Vital Signs ED Triage Vitals  Enc Vitals Group     BP 04/19/20 1357 129/82     Pulse Rate 04/19/20 1357 (!) 105     Resp 04/19/20 1357 18     Temp 04/19/20 1357 98.9 F (37.2 C)     Temp Source 04/19/20 1357 Oral     SpO2 04/19/20 1357 99 %     Weight --  Height --      Head Circumference --      Peak Flow --      Pain Score 04/19/20 1356 5     Pain Loc --      Pain Edu? --      Excl. in [place]? --    No data found.  Updated Vital Signs BP 129/82 (BP Location: Left Arm)   Pulse (!) 105   Temp 98.9 F (37.2 C) (Oral)   Resp 18   LMP 04/12/2020 (Exact Date)   SpO2 99%   Physical Exam Vitals and nursing note reviewed.  Constitutional:      General: She is not in acute distress.    Appearance: Normal appearance. She is not ill-appearing, toxic-appearing or diaphoretic.  HENT:     Head: Normocephalic and atraumatic.     Right Ear: Tympanic membrane, ear canal and external ear normal.     Left Ear: Tympanic membrane, ear canal and external ear normal.     Nose: Congestion and rhinorrhea (clear) present.     Comments: Erythema of nasal passages    Mouth/Throat:     Mouth: Mucous membranes are moist.      Pharynx: Oropharynx is clear. No oropharyngeal exudate or posterior oropharyngeal erythema.  Eyes:     Extraocular Movements: Extraocular movements intact.     Pupils: Pupils are equal, round, and reactive to light.  Cardiovascular:     Rate and Rhythm: Normal rate and regular rhythm.     Heart sounds: Normal heart sounds.  Pulmonary:     Effort: Pulmonary effort is normal.     Breath sounds: Normal breath sounds. Rhonchi: scant possible rhonchi of the posterior right lower lobe.  Abdominal:     Palpations: Abdomen is soft.  Musculoskeletal:     Cervical back: Normal range of motion and neck supple.  Lymphadenopathy:     Cervical: No cervical adenopathy.  Skin:    General: Skin is warm.     Coloration: Skin is not jaundiced or pale.  Neurological:     Mental Status: She is alert and oriented to person, place, and time.      UC Treatments / Results  Labs (all labs ordered are listed, but only abnormal results are displayed) Labs Reviewed - No data to display  EKG   Radiology No results found.  Procedures Procedures (including critical care time)  Medications Ordered in UC Medications - No data to display  Initial Impression / Assessment and Plan / UC Course  I have reviewed the triage vital signs and the nursing notes.  Pertinent labs & imaging results that were available during my care of the patient were reviewed by me and considered in my medical decision making (see chart for details).     New. Chest x ray pending. If normal will treat with tessalon and flonase along with rest. If unchanged in 5 days I would recommend PCP evaluation for additional work up/bloodwork.  If pneumonia is seen we will also treat her for this accordingly. Discussed red flag signs and symptoms.  Final Clinical Impressions(s) / UC Diagnoses   Final diagnoses:  None   Discharge Instructions   None    ED Prescriptions    None     PDMP not reviewed this encounter.   Hughie Closs, [place] 04/19/20 2045

## 2020-04-19 NOTE — ED Triage Notes (Signed)
Pt c/o fever, runny nose, body aches, cough, and sore throat x 2 weeks.  Pt states she has been OTC medicine and has had some relief.

## 2020-04-20 LAB — SARS CORONAVIRUS 2 (TAT 6-24 HRS): SARS Coronavirus 2: NEGATIVE

## 2020-05-25 ENCOUNTER — Encounter: Payer: Self-pay | Admitting: Physician Assistant

## 2020-05-25 ENCOUNTER — Ambulatory Visit: Payer: 59 | Attending: Physician Assistant | Admitting: Physician Assistant

## 2020-05-25 ENCOUNTER — Other Ambulatory Visit: Payer: Self-pay

## 2020-05-25 ENCOUNTER — Other Ambulatory Visit: Payer: Self-pay | Admitting: Physician Assistant

## 2020-05-25 VITALS — BP 108/76 | HR 75 | Resp 18 | Ht 66.0 in | Wt 193.6 lb

## 2020-05-25 DIAGNOSIS — R42 Dizziness and giddiness: Secondary | ICD-10-CM | POA: Diagnosis not present

## 2020-05-25 DIAGNOSIS — R112 Nausea with vomiting, unspecified: Secondary | ICD-10-CM | POA: Insufficient documentation

## 2020-05-25 DIAGNOSIS — D352 Benign neoplasm of pituitary gland: Secondary | ICD-10-CM | POA: Diagnosis not present

## 2020-05-25 DIAGNOSIS — G43009 Migraine without aura, not intractable, without status migrainosus: Secondary | ICD-10-CM | POA: Diagnosis present

## 2020-05-25 DIAGNOSIS — R110 Nausea: Secondary | ICD-10-CM | POA: Diagnosis present

## 2020-05-25 LAB — POCT URINALYSIS DIP (CLINITEK)
Bilirubin, UA: NEGATIVE
Glucose, UA: NEGATIVE mg/dL
Ketones, POC UA: NEGATIVE mg/dL
Nitrite, UA: NEGATIVE
POC PROTEIN,UA: 30 — AB
Spec Grav, UA: 1.015 (ref 1.010–1.025)
Urobilinogen, UA: 1 E.U./dL
pH, UA: 5.5 (ref 5.0–8.0)

## 2020-05-25 LAB — POCT URINE PREGNANCY: Preg Test, Ur: NEGATIVE

## 2020-05-25 MED ORDER — PROMETHAZINE HCL 12.5 MG PO TABS
12.5000 mg | ORAL_TABLET | Freq: Three times a day (TID) | ORAL | 0 refills | Status: DC | PRN
Start: 2020-05-25 — End: 2020-08-17

## 2020-05-25 NOTE — Patient Instructions (Addendum)
Go to the emergency department or call 911 if you worsens at all

## 2020-05-25 NOTE — Progress Notes (Signed)
Patient ID: Kari Coffey, female   DOB: 18-Apr-1975, 45 y.o.   MRN: [id number]   Kari Coffey, is a 45 y.o. female  TDV:[id number]  GGY:[id number]  DOB - Feb 12, 1975  Subjective:  Chief Complaint and HPI: Kari Coffey is a 45 y.o. female here today with 2 day h/o nausea persisting after a migraine 2 days ago.  She has vomited 3 times today.  No fever.  Did not f/up with neurology/MRI for pituitary microadenoma.  No abdominal pain.  Feels some dizziness as well.  Poor appetite X 2 days.  No diarrhea.  Her aunt was recently sick with similar.  No urinary s/sx.  No vision changes  ROS:   Constitutional:  No f/c, No night sweats, No unexplained weight loss. EENT:  No vision changes, No blurry vision, No hearing changes. No mouth, throat, or ear problems.  Respiratory: No cough, No SOB Cardiac: No CP, no palpitations GI:  No abd pain, + N/V no D/C. GU: No Urinary s/sx Musculoskeletal: No joint pain Neuro: resolved headache, + dizziness, no motor weakness.  Skin: No rash Endocrine:  No polydipsia. No polyuria.  Psych: Denies SI/HI  No problems updated.  ALLERGIES: Allergies  Allergen Reactions  . Peanut-Containing Drug Products Shortness Of Breath    Unknown   . Codeine     Hallucinations     PAST MEDICAL HISTORY: Past Medical History:  Diagnosis Date  . Brain tumor ([place])   . GERD (gastroesophageal reflux disease)    uses Zofran  . Headache(784.0)    when pituary tumor was present  . No pertinent past medical history   . Obesity   . Pituitary macroadenoma ([place])   . Pituitary macroadenoma ([place]) 02/09/2019  . Pneumonia 2007  . Sickle cell trait ([place])     MEDICATIONS AT HOME: Prior to Admission medications   Medication Sig Start Date End Date Taking? Authorizing Provider  cabergoline (DOSTINEX) 0.5 MG tablet TAKE 2 TABLETS (1 MG TOTAL) BY MOUTH 2 TIMES A WEEK ON MONDAYS AND THURSDAYS, 08/17/14  Yes Dennie Bible, NP  fluticasone [facility]) 50 MCG/ACT nasal  spray Place 2 sprays into both nostrils daily. 04/19/20  Yes [place], Sarah M, PA-C  meloxicam (MOBIC) 7.5 MG tablet Take 1 tablet (7.5 mg total) by mouth daily. 02/09/19  Yes Charlott Rakes, MD  promethazine (PHENERGAN) 12.5 MG tablet Take 1 tablet (12.5 mg total) by mouth every 8 (eight) hours as needed for nausea or vomiting. Or dizziness 05/25/20  Yes Argentina Donovan, PA-C  benzonatate (TESSALON) 100 MG capsule Take 1 capsule (100 mg total) by mouth every 8 (eight) hours. Patient not taking: Reported on 05/25/2020 04/19/20   Hughie Closs, PA-C     Objective:  EXAM:   Vitals:   05/25/20 1551  BP: 108/76  Pulse: 75  Resp: 18  SpO2: 96%  Weight: 193 lb 9.6 oz (87.8 kg)  Height: 5\' 6"  (1.676 m)    General appearance : A&OX3. NAD. Non-toxic-appearing HEENT: Atraumatic and Normocephalic.  PERRLA. EOM intact. Neck: supple, no JVD. No cervical lymphadenopathy. No thyromegaly Chest/Lungs:  Breathing-non-labored, Good air entry bilaterally, breath sounds normal without rales, rhonchi, or wheezing  CVS: S1 S2 regular, no murmurs, gallops, rubs  Extremities: Bilateral Lower Ext shows no edema, both legs are warm to touch with = pulse throughout Neurology:  CN II-XII grossly intact, Non focal.   Psych:  TP linear. J/I WNL. Normal speech. Appropriate eye contact and affect.  Skin:  No Rash  Data Review  No results found for: HGBA1C   Assessment & Plan   1. Nausea and vomiting, intractability of vomiting not specified, unspecified vomiting type -recently exposed to similar illness - POCT URINALYSIS DIP (CLINITEK) - POCT urine pregnancy - Comprehensive metabolic panel - CBC with Differential/Platelet - promethazine (PHENERGAN) 12.5 MG tablet; Take 1 tablet (12.5 mg total) by mouth every 8 (eight) hours as needed for nausea or vomiting. Or dizziness  Dispense: 30 tablet; Refill: 0  2. Dizziness No red flags-likely dehydrated. Gave 30 ounces water Given h/o of pituitary microadenoma  and elevated Prolacatin, I have instructed her to call 911 or go to the ED if anything worsens.  Patient verbalizes understanding.   - Comprehensive metabolic panel - CBC with Differential/Platelet - promethazine (PHENERGAN) 12.5 MG tablet; Take 1 tablet (12.5 mg total) by mouth every 8 (eight) hours as needed for nausea or vomiting. Or dizziness  Dispense: 30 tablet; Refill: 0  3. Pituitary macroadenoma ([place]) - Ambulatory referral to Neurology - Prolactin  See PCP in 3 months;  Sooner if needed   Patient have been counseled extensively about nutrition and exercise  The patient was given clear instructions to go to ER or return to medical center if symptoms don't improve, worsen or new problems develop. The patient verbalized understanding. The patient was told to call to get lab results if they haven't heard anything in the next week.     Freeman Caldron, PA-C [facility] and [place], [place]   05/25/2020, 4:29 PM

## 2020-05-26 LAB — COMPREHENSIVE METABOLIC PANEL
ALT: 9 IU/L (ref 0–32)
AST: 13 IU/L (ref 0–40)
Albumin/Globulin Ratio: 1 — ABNORMAL LOW (ref 1.2–2.2)
Albumin: 4 g/dL (ref 3.8–4.8)
Alkaline Phosphatase: 102 IU/L (ref 44–121)
BUN/Creatinine Ratio: 13 (ref 9–23)
BUN: 13 mg/dL (ref 6–24)
Bilirubin Total: 0.4 mg/dL (ref 0.0–1.2)
CO2: 23 mmol/L (ref 20–29)
Calcium: 9.2 mg/dL (ref 8.7–10.2)
Chloride: 97 mmol/L (ref 96–106)
Creatinine, Ser: 1 mg/dL (ref 0.57–1.00)
Globulin, Total: 4.2 g/dL (ref 1.5–4.5)
Glucose: 115 mg/dL — ABNORMAL HIGH (ref 65–99)
Potassium: 3.8 mmol/L (ref 3.5–5.2)
Sodium: 134 mmol/L (ref 134–144)
Total Protein: 8.2 g/dL (ref 6.0–8.5)
eGFR: 72 mL/min/{1.73_m2} (ref >59)

## 2020-05-26 LAB — CBC WITH DIFFERENTIAL/PLATELET
Basophils Absolute: 0 10*3/uL (ref 0.0–0.2)
Basos: 0 %
EOS (ABSOLUTE): 0 10*3/uL (ref 0.0–0.4)
Eos: 0 %
Hematocrit: 37.4 % (ref 34.0–46.6)
Hemoglobin: 11.3 g/dL (ref 11.1–15.9)
Immature Grans (Abs): 0.1 10*3/uL (ref 0.0–0.1)
Immature Granulocytes: 1 %
Lymphocytes Absolute: 1.9 10*3/uL (ref 0.7–3.1)
Lymphs: 18 %
MCH: 21 pg — ABNORMAL LOW (ref 26.6–33.0)
MCHC: 30.2 g/dL — ABNORMAL LOW (ref 31.5–35.7)
MCV: 69 fL — ABNORMAL LOW (ref 79–97)
Monocytes Absolute: 1.3 10*3/uL — ABNORMAL HIGH (ref 0.1–0.9)
Monocytes: 13 %
Neutrophils Absolute: 7.4 10*3/uL — ABNORMAL HIGH (ref 1.4–7.0)
Neutrophils: 68 %
Platelets: 331 10*3/uL (ref 150–450)
RBC: 5.39 x10E6/uL — ABNORMAL HIGH (ref 3.77–5.28)
RDW: 16.2 % — ABNORMAL HIGH (ref 11.7–15.4)
WBC: 10.7 10*3/uL (ref 3.4–10.8)

## 2020-05-26 LAB — PROLACTIN: Prolactin: 150 ng/mL — ABNORMAL HIGH (ref 4.8–23.3)

## 2020-05-27 ENCOUNTER — Telehealth: Payer: Self-pay | Admitting: Family Medicine

## 2020-05-27 NOTE — Telephone Encounter (Signed)
Copied from [place] [id number]. Topic: General - Other >> May 27, 2020  2:41 PM Kari Coffey A wrote: Reason for CRM: Patient would like to be contacted regarding lab results from 05/25/20  Please contact to further advise when available   Patient had appt with [facility] on 05/25/20

## 2020-05-27 NOTE — Telephone Encounter (Signed)
Pt was called and informed of lab results. 

## 2020-05-29 ENCOUNTER — Emergency Department (HOSPITAL_COMMUNITY): Payer: 59

## 2020-05-29 ENCOUNTER — Emergency Department (HOSPITAL_COMMUNITY)
Admission: EM | Admit: 2020-05-29 | Discharge: 2020-05-29 | Disposition: A | Discharge status: Home / Self Care | Payer: 59 | Attending: Emergency Medicine | Admitting: Emergency Medicine

## 2020-05-29 ENCOUNTER — Other Ambulatory Visit: Payer: Self-pay

## 2020-05-29 ENCOUNTER — Encounter (HOSPITAL_COMMUNITY): Payer: Self-pay

## 2020-05-29 DIAGNOSIS — Z91010 Allergy to peanuts: Secondary | ICD-10-CM | POA: Insufficient documentation

## 2020-05-29 DIAGNOSIS — R112 Nausea with vomiting, unspecified: Secondary | ICD-10-CM | POA: Diagnosis not present

## 2020-05-29 DIAGNOSIS — R519 Headache, unspecified: Secondary | ICD-10-CM | POA: Diagnosis not present

## 2020-05-29 LAB — CBC WITH DIFFERENTIAL/PLATELET
Abs Immature Granulocytes: 0 10*3/uL (ref 0.00–0.07)
Basophils Absolute: 0 10*3/uL (ref 0.0–0.1)
Basophils Relative: 0 %
Eosinophils Absolute: 0.1 10*3/uL (ref 0.0–0.5)
Eosinophils Relative: 1 %
HCT: 33 % — ABNORMAL LOW (ref 36.0–46.0)
Hemoglobin: 10.2 g/dL — ABNORMAL LOW (ref 12.0–15.0)
Lymphocytes Relative: 28 %
Lymphs Abs: 2.9 10*3/uL (ref 0.7–4.0)
MCH: 21.3 pg — ABNORMAL LOW (ref 26.0–34.0)
MCHC: 30.9 g/dL (ref 30.0–36.0)
MCV: 68.8 fL — ABNORMAL LOW (ref 80.0–100.0)
Monocytes Absolute: 0.7 10*3/uL (ref 0.1–1.0)
Monocytes Relative: 7 %
Neutro Abs: 6.7 10*3/uL (ref 1.7–7.7)
Neutrophils Relative %: 64 %
Platelets: 425 10*3/uL — ABNORMAL HIGH (ref 150–400)
RBC: 4.8 MIL/uL (ref 3.87–5.11)
RDW: 15.7 % — ABNORMAL HIGH (ref 11.5–15.5)
WBC: 10.5 10*3/uL (ref 4.0–10.5)
nRBC: 0 % (ref 0.0–0.2)
nRBC: 0 /100 WBC

## 2020-05-29 LAB — BASIC METABOLIC PANEL
Anion gap: 8 (ref 5–15)
BUN: 6 mg/dL (ref 6–20)
CO2: 27 mmol/L (ref 22–32)
Calcium: 8.9 mg/dL (ref 8.9–10.3)
Chloride: 102 mmol/L (ref 98–111)
Creatinine, Ser: 0.8 mg/dL (ref 0.44–1.00)
GFR, Estimated: >60 mL/min (ref >60)
Glucose, Bld: 103 mg/dL — ABNORMAL HIGH (ref 70–99)
Potassium: 3.6 mmol/L (ref 3.5–5.1)
Sodium: 137 mmol/L (ref 135–145)

## 2020-05-29 LAB — PREGNANCY, URINE: Preg Test, Ur: NEGATIVE

## 2020-05-29 MED ORDER — KETOROLAC TROMETHAMINE 10 MG PO TABS: 10.0000 mg | ORAL_TABLET | Freq: Three times a day (TID) | ORAL | 0 refills | Status: AC | PRN

## 2020-05-29 MED ORDER — LACTATED RINGERS IV BOLUS
1000.0000 mL | Freq: Once | INTRAVENOUS | Status: AC
  Administered 2020-05-29: 1000 mL via INTRAVENOUS

## 2020-05-29 MED ORDER — DIPHENHYDRAMINE HCL 50 MG/ML IJ SOLN
25.0000 mg | Freq: Once | INTRAMUSCULAR | Status: AC
  Administered 2020-05-29: 25 mg via INTRAVENOUS
  Filled 2020-05-29: qty 1

## 2020-05-29 MED ORDER — MUPIROCIN CALCIUM 2 % EX CREA
TOPICAL_CREAM | Freq: Once | CUTANEOUS | Status: AC
  Administered 2020-05-29: 1 via TOPICAL
  Filled 2020-05-29: qty 15

## 2020-05-29 MED ORDER — PROCHLORPERAZINE EDISYLATE 10 MG/2ML IJ SOLN
10.0000 mg | Freq: Once | INTRAMUSCULAR | Status: AC
  Administered 2020-05-29: 10 mg via INTRAVENOUS
  Filled 2020-05-29: qty 2

## 2020-05-29 MED ORDER — KETOROLAC TROMETHAMINE 15 MG/ML IJ SOLN: 15.0000 mg | Freq: Once | INTRAMUSCULAR | Status: DC

## 2020-05-29 NOTE — ED Triage Notes (Signed)
Pt reports ongoing symptoms of feeling dizzy and passing out, constantly going to sleep and nerve pain. Reports her pain level is higher than a 10. Seen another MD and they refused to treat her because of her hx.

## 2020-05-29 NOTE — ED Notes (Signed)
Pt reports she woke Monday with what she thought was a headache, but realized it was her nerves pushing against her brain. Pt went to an unknown dr Monday, they refused to see her. Pt then followed up with the dr's at the [facility], they ran several test and sent her home. Pt reports ongoing dizziness and headache

## 2020-05-29 NOTE — ED Provider Notes (Signed)
[place] EMERGENCY DEPARTMENT Provider Note   CSN: [id number] Arrival date & time: 05/29/20  1200     History Chief Complaint  Patient presents with  . Near Syncope    Kari Coffey is a 45 y.o. female.  HPI  Patient presents with headache.  Left-sided.  10 out of 10.  Throbbing.  Present all the way from the front to the back of her head.  Has had similar headaches before but not for a long time.  Has a history of pituitary macroadenoma status post transsphenoidal resection in 2011.  Headache has been present for several days.  She got nauseous and vomited the other day, describes nonbloody nonbilious.  She thinks it was probably due to the headache.  Drinking lots of water and tolerating water but has not been eating food.  No focal weakness.  Feels dizzy when she stands up.  No falls or head traumas.  Triage complaint listed as near syncope; patient states that she fell like she almost passed out on Monday got very lightheaded, but does not report full syncope.  No chest pain or trouble breathing.     Past Medical History:  Diagnosis Date  . Brain tumor ([place])   . GERD (gastroesophageal reflux disease)    uses Zofran  . Headache(784.0)    when pituary tumor was present  . No pertinent past medical history   . Obesity   . Pituitary macroadenoma ([place])   . Pituitary macroadenoma ([place]) 02/09/2019  . Pneumonia 2007  . Sickle cell trait [facility])     Patient Active Problem List   Diagnosis Date Noted  . Pituitary macroadenoma ([place]) 02/09/2019  . Routine general medical examination at a health care facility 08/23/2014  . Rash and nonspecific skin eruption 08/12/2014  . Pituitary macroadenoma ([place]) 04/10/2012    Past Surgical History:  Procedure Laterality Date  . CHOLECYSTECTOMY N/A 10/19/2013   Procedure: LAPAROSCOPIC CHOLECYSTECTOMY ;  Surgeon: Doreen Salvage, MD;  Location: [facility];  Service: General;  Laterality: N/A;  . GALLBLADDER SURGERY    .  Transspphenoidal Hypophysectomy  08/04/09   via transnasal apporach     OB History   No obstetric history on file.     Family History  Problem Relation Age of Onset  . Diabetes Mother   . Healthy Father   . Healthy Sister   . Healthy Brother   . Healthy Sister   . Healthy Brother   . Healthy Brother   . Healthy Brother   . Heart disease Mother   . Heart failure Mother   . Healthy Maternal Grandfather   . Healthy Paternal Grandmother   . Healthy Paternal Grandfather     Social History   Tobacco Use  . Smoking status: Never Smoker  . Smokeless tobacco: Never Used  Substance Use Topics  . Alcohol use: Never  . Drug use: Never    Home Medications Prior to Admission medications   Medication Sig Start Date End Date Taking? Authorizing Provider  ketorolac (TORADOL) 10 MG tablet Take 1 tablet (10 mg total) by mouth every 8 (eight) hours as needed for up to 5 days. 05/29/20 06/03/20 Yes Aris Lot, MD  benzonatate (TESSALON) 100 MG capsule Take 1 capsule (100 mg total) by mouth every 8 (eight) hours. Patient not taking: Reported on 05/25/2020 04/19/20   Hughie Closs, PA-C  cabergoline (DOSTINEX) 0.5 MG tablet TAKE 2 TABLETS (1 MG TOTAL) BY MOUTH 2 TIMES A WEEK ON MONDAYS AND  THURSDAYS, 08/17/14   Dennie Bible, NP  fluticasone [facility]) 50 MCG/ACT nasal spray Place 2 sprays into both nostrils daily. 04/19/20   Hughie Closs, PA-C  meloxicam (MOBIC) 7.5 MG tablet Take 1 tablet (7.5 mg total) by mouth daily. 02/09/19   Charlott Rakes, MD  promethazine (PHENERGAN) 12.5 MG tablet Take 1 tablet (12.5 mg total) by mouth every 8 (eight) hours as needed for nausea or vomiting. Or dizziness 05/25/20   Argentina Donovan, PA-C    Allergies    Peanut-containing drug products and Codeine  Review of Systems   Review of Systems  Constitutional: Negative for chills and fever.  HENT: Negative for ear pain and sore throat.   Eyes: Negative for pain and visual disturbance.   Respiratory: Negative for cough and shortness of breath.   Cardiovascular: Negative for chest pain and palpitations.  Gastrointestinal: Positive for nausea and vomiting. Negative for abdominal pain.  Genitourinary: Negative for dysuria and hematuria.  Musculoskeletal: Negative for arthralgias and back pain.  Skin: Negative for color change and rash.  Neurological: Positive for headaches. Negative for seizures and syncope.  All other systems reviewed and are negative.   Physical Exam Updated Vital Signs BP (!) 142/93 (BP Location: Right Arm)   Pulse 77   Temp 99.4 F (37.4 C) (Oral)   Resp 19   SpO2 95%   Physical Exam Vitals and nursing note reviewed.  Constitutional:      General: She is not in acute distress.    Appearance: She is well-developed.  HENT:     Head: Normocephalic and atraumatic.  Eyes:     Conjunctiva/sclera: Conjunctivae normal.  Cardiovascular:     Rate and Rhythm: Normal rate and regular rhythm.     Heart sounds: No murmur heard.   Pulmonary:     Effort: Pulmonary effort is normal. No respiratory distress.     Breath sounds: Normal breath sounds.  Abdominal:     Palpations: Abdomen is soft.     Tenderness: There is no abdominal tenderness.  Musculoskeletal:     Cervical back: Neck supple.  Skin:    General: Skin is warm and dry.     Capillary Refill: Capillary refill takes less than 2 seconds.     Comments: Honey crusted lesions on lower lip  Neurological:     Mental Status: She is alert.     Comments:   MENTAL STATUS: AAOx3   LANG/SPEECH: Fluent, intact medical history with good comprehension   CRANIAL NERVES:   II: Pupils equal and reactive   III, IV, VI: EOM intact, no gaze preference or deviation   V: normal   VII: no facial asymmetry   VIII: normal hearing to speech   MOTOR: 5/5 in both upper and lower extremities   SENSORY: Equal and symmetric in bilateral upper and lower extremities   COORD: Normal finger to nose, no tremor, no  dysmetria  Psychiatric:        Thought Content: Thought content normal.        Judgment: Judgment normal.     ED Results / Procedures / Treatments   Labs (all labs ordered are listed, but only abnormal results are displayed) Labs Reviewed  CBC WITH DIFFERENTIAL/PLATELET - Abnormal; Notable for the following components:      Result Value   Hemoglobin 10.2 (*)    HCT 33.0 (*)    MCV 68.8 (*)    MCH 21.3 (*)    RDW 15.7 (*)  Platelets 425 (*)    All other components within normal limits  BASIC METABOLIC PANEL - Abnormal; Notable for the following components:   Glucose, Bld 103 (*)    All other components within normal limits  PREGNANCY, URINE  HCG, SERUM, QUALITATIVE    EKG None  Radiology CT Head Wo Contrast  Result Date: 05/29/2020 CLINICAL DATA:  Headache and dizziness EXAM: CT HEAD WITHOUT CONTRAST TECHNIQUE: Contiguous axial images were obtained from the base of the skull through the vertex without intravenous contrast. COMPARISON:  May 12, 2009 head CT; brain MRI April 01, 2013 FINDINGS: Brain: Ventricles and sulci are normal in size and configuration. In the sellar region, there is soft tissue fullness with extensive bony destruction of much of the sella and most of the clivus, a finding present previously. Extension of this mass occupying the sphenoid sinus regions with bony expansion and destruction is again noted, similar to prior study. Elsewhere, brain parenchyma appears normal without evident acute infarct. No new mass. No hemorrhage, extra-axial fluid collection, or midline shift. Vascular: No hyperdense vessels. No appreciable vascular calcification. Skull: Bony destruction in the sella and clivus region as noted as well as bony destruction and expansion in sphenoid sinuses. Bony calvarium otherwise appears intact. Sinuses/Orbits: Opacification throughout the sphenoid sinus regions. Other paranasal sinuses clear. Orbits appear symmetric bilaterally. Other: Mastoid air  cells clear. IMPRESSION: Residua from pituitary region mass with bony destruction and expansion in the peri sellar region with extension of mass into the sphenoid sinus regions. Destruction of most of the clivus noted. Elsewhere no intracranial mass or hemorrhage. No acute infarct. No extra-axial fluid. Given the extent of abnormality in the sella and adjacent regions, correlation with MR pre and post contrast may well be advisable to further assess. Electronically Signed   By: Lowella Grip III M.D.   On: 05/29/2020 19:51    Procedures Procedures   Medications Ordered in ED Medications  lactated ringers bolus 1,000 mL (0 mLs Intravenous Stopped 05/29/20 2109)  prochlorperazine (COMPAZINE) injection 10 mg (10 mg Intravenous Given 05/29/20 1731)  diphenhydrAMINE (BENADRYL) injection 25 mg (25 mg Intravenous Given 05/29/20 1731)  mupirocin cream (BACTROBAN) 2 % (1 application Topical Given 05/29/20 1912)    ED Course  I have reviewed the triage vital signs and the nursing notes.  Pertinent labs & imaging results that were available during my care of the patient were reviewed by me and considered in my medical decision making (see chart for details).    MDM Rules/Calculators/A&P                          Patient presents with severe headache.  She says this feels similar to when she was diagnosed with a pituitary macroadenoma.  Not on current medication for pituitary macroadenoma.  Differential diagnoses considered include migraine, intracranial hemorrhage.  No focal neurologic deficit on exam, vital signs stable.  Will treat for migraine and obtain head imaging.  Labs overall unremarkable. Mild Hb drop; appropriate for outpt recheck given no foci of bleeding.  CT head with evidence of pituitary mass lesion with erosion into sphenoid sinus.  On repeat evaluation the patient states her headache is gone and she denies any visual disturbance or diplopia.  Discussed with neurosurgery who agreed  that the patient is safe for outpatient follow-up.  Discussed that plan for outpatient follow-up with the patient and stressed the importance of this and she agrees and understands.  Return precautions advised  Final Clinical Impression(s) / ED Diagnoses Final diagnoses:  Nonintractable headache, unspecified chronicity pattern, unspecified headache type    Rx / DC Orders ED Discharge Orders         Ordered    ketorolac (TORADOL) 10 MG tablet  Every 8 hours PRN        05/29/20 2057           Aris Lot, MD 05/29/20 2119    Lajean Saver, MD 05/30/20 1720

## 2020-05-29 NOTE — ED Notes (Signed)
Pt returned from CT °

## 2020-05-29 NOTE — Discharge Instructions (Addendum)
Please come back if your headache is not controlled with the outpatient medication.  Otherwise please follow-up with your neurosurgery doctors at [facility] or here at the phone number listed.  Keep your appointment with your endocrinologist to consider therapy that may shrink your tumor.

## 2020-05-29 NOTE — ED Provider Notes (Signed)
Emergency Medicine Provider Triage Evaluation Note  Kari Coffey , a 45 y.o. female  was evaluated in triage.  Pt complains of a headache Pt reports she has headaches but this is worse.  Pt reports she is being treated for a pituitary tumor   Review of Systems  Positive: dizziness Negative: Cough or uri  Physical Exam  BP 114/76 (BP Location: Left Arm)   Pulse 86   Temp 99.5 F (37.5 C) (Oral)   Resp 18   SpO2 95%  Gen:   Awake, no distress   Resp:  Normal effort  MSK:   Moves extremities without difficulty  Other:    Medical Decision Making  Medically screening exam initiated at 1:08 PM.  Appropriate orders placed.  [name] was informed that the remainder of the evaluation will be completed by another provider, this initial triage assessment does not replace that evaluation, and the importance of remaining in the ED until their evaluation is complete.     Fransico Meadow, [place] 05/29/20 1309    Tegeler, Gwenyth Allegra, MD 05/29/20 2030

## 2020-05-29 NOTE — ED Notes (Signed)
Pt called for triage with no response. 

## 2020-06-01 ENCOUNTER — Telehealth: Payer: Self-pay | Admitting: Family Medicine

## 2020-06-01 NOTE — Telephone Encounter (Signed)
Called to request patient's most recent lab results.  Please advise and call to discuss at [phone]

## 2020-06-01 NOTE — Telephone Encounter (Signed)
Please put patient through to [facility] Zoa Dowty when call is returned.

## 2020-08-17 ENCOUNTER — Encounter: Payer: Self-pay | Admitting: Neurology

## 2020-08-17 ENCOUNTER — Ambulatory Visit: Payer: 59 | Admitting: Neurology

## 2020-08-17 ENCOUNTER — Other Ambulatory Visit: Payer: Self-pay

## 2020-08-17 VITALS — BP 134/92 | HR 86 | Ht 66.0 in | Wt 198.0 lb

## 2020-08-17 DIAGNOSIS — D352 Benign neoplasm of pituitary gland: Secondary | ICD-10-CM

## 2020-08-17 DIAGNOSIS — G4489 Other headache syndrome: Secondary | ICD-10-CM | POA: Diagnosis not present

## 2020-08-17 MED ORDER — CABERGOLINE 0.5 MG PO TABS: ORAL_TABLET | ORAL | 11 refills | Status: AC

## 2020-08-17 NOTE — Progress Notes (Signed)
Reason for visit: Headache, history of pituitary macroadenoma  Referring physician: [place]  Kari Coffey is a 45 y.o. 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  The patient has been followed through endocrinology, she was on cabergoline currently.  Her most recent prolactin level was 150.  The patient went to the emergency room on 29 May 2020 with a severe headache.  The patient claims that she really has not had any problems with headaches since that time.  However, over the last 2 to 3 weeks she has noted onset of clear liquid draining profusely from her nasal passageways.  When she bends over, she has significant drainage with liquid that is clear coming from her nose.  She denies any other symptoms such as visual changes, numbness or weakness of the face, arms, legs.  She denies any balance problems or difficulty controlling the bowels or the bladder.  Once again, she is no longer having any headaches.  She comes here for further evaluation.  Past Medical History:  Diagnosis Date   Brain tumor ([place])    GERD (gastroesophageal reflux disease)    uses Zofran   Headache(784.0)    when pituary tumor was present   No pertinent past medical history    Obesity    Pituitary macroadenoma ([place]) 02/09/2019   Pneumonia 01/15/2005   Sickle cell trait ([place])     Past Surgical History:  Procedure Laterality Date   CHOLECYSTECTOMY N/A 10/19/2013   Procedure: LAPAROSCOPIC CHOLECYSTECTOMY ;  Surgeon: Doreen Salvage, MD;  Location: [facility];  Service: General;  Laterality: N/A;   GALLBLADDER SURGERY     Transspphenoidal Hypophysectomy  08/04/09   via transnasal apporach    Family History  Problem Relation Age of Onset   Diabetes Mother    Heart disease Mother    Heart failure Mother    Healthy Father    Healthy Sister    Healthy Sister     Healthy Brother    Healthy Brother    Healthy Brother    Healthy Brother    Healthy Maternal Grandfather    Healthy Paternal Grandmother    Healthy Paternal Grandfather     Social history:  reports that she has never smoked. She has never used smokeless tobacco. She reports that she does not drink alcohol and does not use drugs.  Medications:  Prior to Admission medications   Medication Sig Start Date End Date Taking? Authorizing Provider  cabergoline (DOSTINEX) 0.5 MG tablet TAKE 2 TABLETS (1 MG TOTAL) BY MOUTH 2 TIMES A WEEK ON MONDAYS AND THURSDAYS, 08/17/14  Yes Dennie Bible, NP  levothyroxine (SYNTHROID) 50 MCG tablet Take 50 mcg by mouth daily. 06/20/20  Yes [provider]      Allergies  Allergen Reactions   Peanut-Containing Drug Products Shortness Of Breath    Unknown    Codeine     Hallucinations     ROS:  Out of a complete 14 system review of symptoms, the patient complains only of the following symptoms, and all other reviewed systems are negative.  Nasal drainage Occasional headache  Blood pressure (!) 134/92, pulse 86, height '5\' 6"'$  (1.676 m), weight 198 lb (89.8 kg).  Physical Exam  General: The patient is alert and cooperative at the time of the examination.  The patient is moderately obese.  Eyes: Pupils  are equal, round, and reactive to light. Discs are flat bilaterally.  Neck: The neck is supple, no carotid bruits are noted.  Respiratory: The respiratory examination is clear.  Cardiovascular: The cardiovascular examination reveals a regular rate and rhythm, no obvious murmurs or rubs are noted.  Skin: Extremities are without significant edema.  Neurologic Exam  Mental status: The patient is alert and oriented x 3 at the time of the examination. The patient has apparent normal recent and remote memory, with an apparently normal attention span and concentration ability.  Cranial nerves: Facial symmetry is present. There is good  sensation of the face to pinprick and soft touch bilaterally. The strength of the facial muscles and the muscles to head turning and shoulder shrug are normal bilaterally. Speech is well enunciated, no aphasia or dysarthria is noted. Extraocular movements are full. Visual fields are full. The tongue is midline, and the patient has symmetric elevation of the soft palate. No obvious hearing deficits are noted.  Motor: The motor testing reveals 5 over 5 strength of all 4 extremities. Good symmetric motor tone is noted throughout.  Sensory: Sensory testing is intact to pinprick, soft touch, vibration sensation, and position sense on all 4 extremities. No evidence of extinction is noted.  Coordination: Cerebellar testing reveals good finger-nose-finger and heel-to-shin bilaterally.  Gait and station: Gait is normal. Tandem gait is normal. Romberg is negative. No drift is seen.  Reflexes: Deep tendon reflexes are symmetric and normal bilaterally. Toes are downgoing bilaterally.   CT head 05/29/20:  IMPRESSION: Residua from pituitary region mass with bony destruction and expansion in the peri sellar region with extension of mass into the sphenoid sinus regions. Destruction of most of the clivus noted.   Elsewhere no intracranial mass or hemorrhage. No acute infarct. No extra-axial fluid.   Given the extent of abnormality in the sella and adjacent regions, correlation with MR pre and post contrast may well be advisable to further assess.   * CT scan images were reviewed online. I agree with the written report.    Assessment/Plan:  1.  History of pituitary macroadenoma  2.  Occasional headache  3.  New onset clear nasal drainage  The patient has a history of prior resection of a pituitary macroadenoma, she is now having clear liquid coming from her nose.  She could potentially be at risk for a spinal fluid leak.  She is to collect fluid overnight and come back tomorrow and we will run  testing procedures on the fluid.  If the fluid contains glucose, it is spinal fluid.  The patient claims that she has already been set up to see a neurosurgeon at [facility] in the near future.  We will set the patient up for MRI of the brain to include the pituitary protocol.  Jill Alexanders MD 08/17/2020 2:27 PM  [facility] [address] [place], [place] [address]  Phone [phone] Fax [phone]

## 2020-08-18 NOTE — Addendum Note (Signed)
Addended by: Artist Beach on: 08/18/2020 08:00 AM   Modules accepted: Orders

## 2020-08-19 LAB — GLUCOSE, CSF: Glucose, CSF: 78 mg/dL — ABNORMAL HIGH (ref 49–73)

## 2020-08-19 LAB — PROTEIN, CSF: Protein, CSF: 69.7 mg/dL — ABNORMAL HIGH (ref 0.0–44.0)

## 2020-08-21 ENCOUNTER — Telehealth: Payer: Self-pay | Admitting: Neurology

## 2020-08-21 NOTE — Telephone Encounter (Signed)
I called the patient.  The nasal fluid analysis showed presence of glucose suggesting that it is in fact spinal fluid.  The patient indicates that she has an appointment to see a neurosurgeon at [facility], if this is not the case, she is to contact our office and I will set up an appointment for someone to see her.  MRI of the brain is pending.

## 2020-08-24 ENCOUNTER — Other Ambulatory Visit: Payer: 59

## 2020-08-25 ENCOUNTER — Ambulatory Visit
Admission: RE | Admit: 2020-08-25 | Discharge: 2020-08-25 | Disposition: A | Discharge status: Home / Self Care | Payer: 59 | Source: Ambulatory Visit | Attending: Neurology | Admitting: Neurology

## 2020-08-25 DIAGNOSIS — D352 Benign neoplasm of pituitary gland: Secondary | ICD-10-CM | POA: Diagnosis not present

## 2020-08-25 MED ORDER — GADOBENATE DIMEGLUMINE 529 MG/ML IV SOLN
8.0000 mL | Freq: Once | INTRAVENOUS | Status: AC | PRN
  Administered 2020-08-25: 8 mL via INTRAVENOUS

## 2020-08-28 ENCOUNTER — Telehealth: Payer: Self-pay | Admitting: Neurology

## 2020-08-28 NOTE — Telephone Encounter (Signed)
I called the patient.  The MRI of the brain shows an enlarging pituitary macroadenoma with invasion into the sphenoid sinus, left cavernous sinus, and clivus.  The patient has an appointment to see a neurosurgeon, I have asked her to get a disc from [facility] imaging to bring with her so the neurosurgeon from [facility] can see it.  She will need to have resection of the macroadenoma and repair of the dural leak.   MRI brain 08/27/20:  IMPRESSION: This MRI of the brain with and without contrast shows the following: 1.   3.1 x 3.1 x 2.5 cm heterogenously enhancing sella mass extending anteriorly into the sphenoid sinus, laterally to involve the left cavernous sinus and inferiorly into the clivus.  It is consistent with a pituitary macroadenoma.   2.   Compared to the MRI from 04/01/2013, the macroadenoma  has increased in size with most of the growth occurring into the sphenoid sinus. 3.   The brain is otherwise normal.

## 2020-08-30 NOTE — Telephone Encounter (Signed)
Sent my chart message to the pt about scheduling this visit.

## 2020-11-22 ENCOUNTER — Telehealth: Payer: Self-pay

## 2020-11-22 NOTE — Telephone Encounter (Signed)
Transition Care Management Follow-up Telephone Call Date of discharge and from where: [facility]  11/16/2020 How have you been since you were released from the hospital? "Very well" Any questions or concerns? No  Items Reviewed: Did the pt receive and understand the discharge instructions provided? Yes  Medications obtained and verified? Yes  Other? No  Any new allergies since your discharge? No  Dietary orders reviewed? Yes Do you have support at home? Yes   Home Care and Equipment/Supplies: Were home health services ordered? yes If so, what is the name of the agency? [facility]  Has the agency set up a time to come to the patient's home? yes Were any new equipment or medical supplies ordered?  No What is the name of the medical supply agency?  Were you able to get the supplies/equipment? not applicable Do you have any questions related to the use of the equipment or supplies? No  Functional Questionnaire: (I = Independent and D = Dependent) ADLs: I  Bathing/Dressing- I  Meal Prep- I  Eating- I  Maintaining continence- I  Transferring/Ambulation- I  Managing Meds- I  Follow up appointments reviewed:  PCP Hospital f/u appt confirmed? Yes  Scheduled to see PCP on 11/23/2020 [facility] Hospital f/u appt confirmed? Yes   Are transportation arrangements needed? No  If their condition worsens, is the pt aware to call PCP or go to the Emergency Dept.? Yes Was the patient provided with contact information for the PCP's office or ED? Yes Was to pt encouraged to call back with questions or concerns? Yes  Tomasa Rand, RN, BSN, CEN [facility] [employer] [phone]

## 2020-12-07 ENCOUNTER — Telehealth: Payer: Self-pay

## 2020-12-07 NOTE — Telephone Encounter (Signed)
Transition Care Management Unsuccessful Follow-up Telephone Call  Date of discharge and from where:  11/26/2020  [facility]  Attempts:  1st Attempt  Reason for unsuccessful TCM follow-up call:  No answer/busy   Tomasa Rand, RN, BSN, CEN [place] Coord[facility] [phone]

## 2020-12-07 NOTE — Telephone Encounter (Signed)
Transition Care Management Follow-up Telephone Call Date of discharge and from where: 11/26/2020  [facility] How have you been since you were released from the hospital? Doing better. Any questions or concerns? No  Items Reviewed: Did the pt receive and understand the discharge instructions provided? Yes  Medications obtained and verified? Yes  Other? No  Any new allergies since your discharge? No  Dietary orders reviewed? No Do you have support at home? Yes   Home Care and Equipment/Supplies: Were home health services ordered? not applicable If so, what is the name of the agency? Has the agency set up a time to come to the patient's home? not applicable Were any new equipment or medical supplies ordered?   What is the name of the medical supply agency?  Were you able to get the supplies/equipment? NA Do you have any questions related to the use of the equipment or supplies?   Functional Questionnaire: (I = Independent and D = Dependent) ADLs: I  Bathing/Dressing- I  Meal Prep- I  Eating- I  Maintaining continence- I  Transferring/Ambulation- I  Managing Meds- I  Follow up appointments reviewed: [facility] f/u appt confirmed? Yes   Are transportation arrangements needed? No  If their condition worsens, is the pt aware to call PCP or go to the Emergency Dept.? Yes Was the patient provided with contact information for the PCP's office or ED? Yes Was to pt encouraged to call back with questions or concerns? Yes  Tomasa Rand, RN, BSN, CEN [facility] [employer] [phone]

## 2021-02-21 ENCOUNTER — Encounter: Payer: Self-pay | Admitting: Family Medicine

## 2021-02-21 ENCOUNTER — Ambulatory Visit: Payer: 59 | Attending: Family Medicine | Admitting: Family Medicine

## 2021-02-21 VITALS — BP 142/96 | HR 84 | Ht 66.0 in | Wt 206.6 lb

## 2021-02-21 DIAGNOSIS — Z13228 Encounter for screening for other metabolic disorders: Secondary | ICD-10-CM

## 2021-02-21 DIAGNOSIS — D352 Benign neoplasm of pituitary gland: Secondary | ICD-10-CM | POA: Diagnosis not present

## 2021-02-21 DIAGNOSIS — E669 Obesity, unspecified: Secondary | ICD-10-CM

## 2021-02-21 DIAGNOSIS — R030 Elevated blood-pressure reading, without diagnosis of hypertension: Secondary | ICD-10-CM

## 2021-02-21 DIAGNOSIS — D649 Anemia, unspecified: Secondary | ICD-10-CM

## 2021-02-21 DIAGNOSIS — Z1159 Encounter for screening for other viral diseases: Secondary | ICD-10-CM

## 2021-02-21 NOTE — Patient Instructions (Signed)
Anemia °Anemia is a condition in which there is not enough red blood cells or hemoglobin in the blood. Hemoglobin is a substance in red blood cells that carries oxygen. °When you do not have enough red blood cells or hemoglobin (are anemic), your body cannot get enough oxygen and your organs may not work properly. As a result, you may feel very tired or have other problems. °What are the causes? °Common causes of anemia include: °Excessive bleeding. Anemia can be caused by excessive bleeding inside or outside the body, including bleeding from the intestines or from heavy menstrual periods in females. °Poor nutrition. °Long-lasting (chronic) kidney, thyroid, and liver disease. °Bone marrow disorders, spleen problems, and blood disorders. °Cancer and treatments for cancer. °HIV (human immunodeficiency virus) and AIDS (acquired immunodeficiency syndrome). °Infections, medicines, and autoimmune disorders that destroy red blood cells. °What are the signs or symptoms? °Symptoms of this condition include: °Minor weakness. °Dizziness. °Headache, or difficulties concentrating and sleeping. °Heartbeats that feel irregular or faster than normal (palpitations). °Shortness of breath, especially with exercise. °Pale skin, lips, and nails, or cold hands and feet. °Indigestion and nausea. °Symptoms may occur suddenly or develop slowly. If your anemia is mild, you may not have symptoms. °How is this diagnosed? °This condition is diagnosed based on blood tests, your medical history, and a physical exam. In some cases, a test may be needed in which cells are removed from the soft tissue inside of a bone and looked at under a microscope (bone marrow biopsy). Your health care provider may also check your stool (feces) for blood and may do additional testing to look for the cause of your bleeding. °Other tests may include: °Imaging tests, such as a CT scan or MRI. °A procedure to see inside your esophagus and stomach (endoscopy). °A  procedure to see inside your colon and rectum (colonoscopy). °How is this treated? °Treatment for this condition depends on the cause. If you continue to lose a lot of blood, you may need to be treated at a hospital. Treatment may include: °Taking supplements of iron, vitamin B12, or folic acid. °Taking a hormone medicine (erythropoietin) that can help to stimulate red blood cell growth. °Having a blood transfusion. This may be needed if you lose a lot of blood. °Making changes to your diet. °Having surgery to remove your spleen. °Follow these instructions at home: °Take over-the-counter and prescription medicines only as told by your health care provider. °Take supplements only as told by your health care provider. °Follow any diet instructions that you were given by your health care provider. °Keep all follow-up visits as told by your health care provider. This is important. °Contact a health care provider if: °You develop new bleeding anywhere in the body. °Get help right away if: °You are very weak. °You are short of breath. °You have pain in your abdomen or chest. °You are dizzy or feel faint. °You have trouble concentrating. °You have bloody stools, black stools, or tarry stools. °You vomit repeatedly or you vomit up blood. °These symptoms may represent a serious problem that is an emergency. Do not wait to see if the symptoms will go away. Get medical help right away. Call your local emergency services (911 in the [place] Do not drive yourself to the hospital. °Summary °Anemia is a condition in which you do not have enough red blood cells or enough of a substance in your red blood cells that carries oxygen (hemoglobin). °Symptoms may occur suddenly or develop slowly. °If your anemia is   mild, you may not have symptoms. °This condition is diagnosed with blood tests, a medical history, and a physical exam. Other tests may be needed. °Treatment for this condition depends on the cause of the anemia. °This  information is not intended to replace advice given to you by your health care provider. Make sure you discuss any questions you have with your health care provider. °Document Revised: 12/09/2018 Document Reviewed: 12/09/2018 °Elsevier Patient Education © 2022 [employer] ° °

## 2021-02-21 NOTE — Progress Notes (Signed)
CPE Pap done at GYN office. MM appointment is next week.

## 2021-02-21 NOTE — Progress Notes (Signed)
Subjective:  Patient ID: Kari Coffey, female    DOB: [birth year]  Age: 46 y.o. MRN: [id number]  CC: Annual Exam   HPI Kari Coffey is a 46 y.o. 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)  Interval History: She had a visit with [facility] yesterday and is doing well.  Notes reviewed with plans to continue saline irrigations and follow-up in 6 months.  She is currently on cabergoline and levothyroxine. She states her iron is low. Hemoglobin was 9.8 when checked at [facility] 2 months ago. She has brownish discharge and abdominal cramping last week x3-4 days but does not usually have a cycle due to her pituitary resection.  BP is elevated but she states at her previous office visit it was normal.  She currently is not hypertensive and does not exercise regularly. She is getting ready to start exercising. 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   Pneumonia 01/15/2005   Sickle cell trait ([place])     Past Surgical History:  Procedure Laterality Date   CHOLECYSTECTOMY N/A 10/19/2013   Procedure: LAPAROSCOPIC CHOLECYSTECTOMY ;  Surgeon: Doreen Salvage, MD;  Location: [facility] OR;  Service: General;  Laterality: N/A;   GALLBLADDER SURGERY     Transspphenoidal Hypophysectomy  08/04/09   via transnasal apporach    Family History  Problem Relation Age of Onset   Diabetes Mother    Heart disease Mother    Heart failure Mother    Healthy Father    Healthy Sister    Healthy Sister    Healthy Brother    Healthy Brother    Healthy Brother    Healthy Brother    Healthy Maternal Grandfather    Healthy  Paternal Grandmother    Healthy Paternal Grandfather     Allergies  Allergen Reactions   Peanut-Containing Drug Products Shortness Of Breath    Unknown    Codeine     Hallucinations     Outpatient Medications Prior to Visit  Medication Sig Dispense Refill   cabergoline (DOSTINEX) 0.5 MG tablet TAKE 3 TABLETS (1 MG TOTAL) BY MOUTH 2 TIMES A WEEK ON MONDAYS AND THURSDAYS, 20 tablet 11   levothyroxine (SYNTHROID) 75 MCG tablet levothyroxine 75 mcg tablet     levothyroxine (SYNTHROID) 50 MCG tablet Take 50 mcg by mouth daily. (Patient not taking: Reported on 02/21/2021)     No facility-administered medications prior to visit.     ROS Review of Systems  Constitutional:  Negative for activity change, appetite change and fatigue.  HENT:  Negative for congestion, sinus pressure and sore throat.   Eyes:  Negative for visual disturbance.  Respiratory:  Negative for cough, chest tightness, shortness of breath and wheezing.   Cardiovascular:  Negative for chest pain and palpitations.  Gastrointestinal:  Negative for abdominal distention, abdominal pain and constipation.  Endocrine: Negative for polydipsia.  Genitourinary:  Negative for dysuria and frequency.  Musculoskeletal:  Negative for arthralgias and back pain.  Skin:  Negative for rash.  Neurological:  Negative for tremors, light-headedness and numbness.  Hematological:  Does not bruise/bleed  easily.  Psychiatric/Behavioral:  Negative for agitation and behavioral problems.    Objective:  BP (!) 142/96    Pulse 84    Ht 5\' 6"  (1.676 m)    Wt 206 lb 9.6 oz (93.7 kg)    SpO2 99%    BMI 33.35 kg/m   BP/Weight 02/21/2021 08/17/2020 0/86/5784  Systolic BP 696 295 284  Diastolic BP 96 92 93  Wt. (Lbs) 206.6 198 -  BMI 33.35 31.96 -      Physical Exam Constitutional:      Appearance: She is well-developed. She is obese.  Cardiovascular:     Rate and Rhythm: Normal rate.     Heart sounds: Normal heart sounds. No murmur  heard. Pulmonary:     Effort: Pulmonary effort is normal.     Breath sounds: Normal breath sounds. No wheezing or rales.  Chest:     Chest wall: No tenderness.  Abdominal:     General: Bowel sounds are normal. There is no distension.     Palpations: Abdomen is soft. There is no mass.     Tenderness: There is no abdominal tenderness.  Musculoskeletal:        General: Normal range of motion.     Right lower leg: No edema.     Left lower leg: No edema.  Neurological:     Mental Status: She is alert and oriented to person, place, and time.  Psychiatric:        Mood and Affect: Mood normal.    CMP Latest Ref Rng & Units 05/29/2020 05/25/2020 02/09/2019  Glucose 70 - 99 mg/dL 103(H) 115(H) 91  BUN 6 - 20 mg/dL 6 13 5(L)  Creatinine 0.44 - 1.00 mg/dL 0.80 1.00 0.75  Sodium 135 - 145 mmol/L 137 134 140  Potassium 3.5 - 5.1 mmol/L 3.6 3.8 4.3  Chloride 98 - 111 mmol/L 102 97 104  CO2 22 - 32 mmol/L 27 23 24   Calcium 8.9 - 10.3 mg/dL 8.9 9.2 9.3  Total Protein 6.0 - 8.5 g/dL - 8.2 7.2  Total Bilirubin 0.0 - 1.2 mg/dL - 0.4 <0.2  Alkaline Phos 44 - 121 IU/L - 102 106  AST 0 - 40 IU/L - 13 13  ALT 0 - 32 IU/L - 9 6    Lipid Panel     Component Value Date/Time   CHOL 152 02/09/2019 1120   TRIG 131 02/09/2019 1120   HDL 30 (L) 02/09/2019 1120   CHOLHDL 5.1 (H) 02/09/2019 1120   CHOLHDL 5 08/23/2014 0937   VLDL 19.6 08/23/2014 0937   LDLCALC 98 02/09/2019 1120    CBC    Component Value Date/Time   WBC 10.5 05/29/2020 1316   RBC 4.80 05/29/2020 1316   HGB 10.2 (L) 05/29/2020 1316   HGB 11.3 05/25/2020 1704   HCT 33.0 (L) 05/29/2020 1316   HCT 37.4 05/25/2020 1704   PLT 425 (H) 05/29/2020 1316   PLT 331 05/25/2020 1704   MCV 68.8 (L) 05/29/2020 1316   MCV 69 (L) 05/25/2020 1704   MCH 21.3 (L) 05/29/2020 1316   MCHC 30.9 05/29/2020 1316   RDW 15.7 (H) 05/29/2020 1316   RDW 16.2 (H) 05/25/2020 1704   LYMPHSABS 2.9 05/29/2020 1316   LYMPHSABS 1.9 05/25/2020 1704   MONOABS  0.7 05/29/2020 1316   EOSABS 0.1 05/29/2020 1316   EOSABS 0.0 05/25/2020 1704   BASOSABS 0.0 05/29/2020 1316   BASOSABS 0.0 05/25/2020 1704    No results found for: HGBA1C  Assessment & Plan:  1. Pituitary macroadenoma (HCC) Stable postsurgery Continue cabergoline and levothyroxine Follow-up with [facility] neurosurgery  2. Mild obesity Counseled on reducing portion sizes, exercising and she is willing to work on beginning an exercise regimen  3. Anemia, unspecified type Last hemoglobin was 9.8 Will check level again - CBC with Differential/Platelet - Iron, TIBC and Ferritin Panel  4. Need for hepatitis C screening test  HCV Ab w Reflex to Quant PCR  5. Screening for metabolic disorder - Hemoglobin A1c  6. Elevated blood pressure reading in office without diagnosis of hypertension Lifestyle modifications and will reassess at next office visit Counseled on blood pressure goal of less than 130/80, low-sodium, DASH diet, medication compliance, 150 minutes of moderate intensity exercise per week. Discussed medication compliance, adverse effects.   Health Care Maintenance: Up-to-date on Pap smear with GYN; she has an upcoming appointment for mammogram. No orders of the defined types were placed in this encounter.   Follow-up: Return in about 6 months (around 08/21/2021) for Follow-up of anemia.       Charlott Rakes, MD, FAAFP. [facility] and [place], [place]   02/21/2021, 5:18 PM

## 2021-02-22 ENCOUNTER — Other Ambulatory Visit: Payer: Self-pay | Admitting: Family Medicine

## 2021-02-22 DIAGNOSIS — R7303 Prediabetes: Secondary | ICD-10-CM

## 2021-02-22 LAB — CBC WITH DIFFERENTIAL/PLATELET
Basophils Absolute: 0 10*3/uL (ref 0.0–0.2)
Basos: 0 %
EOS (ABSOLUTE): 0.2 10*3/uL (ref 0.0–0.4)
Eos: 4 %
Hematocrit: 32 % — ABNORMAL LOW (ref 34.0–46.6)
Hemoglobin: 10 g/dL — ABNORMAL LOW (ref 11.1–15.9)
Immature Grans (Abs): 0 10*3/uL (ref 0.0–0.1)
Immature Granulocytes: 0 %
Lymphocytes Absolute: 1.9 10*3/uL (ref 0.7–3.1)
Lymphs: 42 %
MCH: 20.7 pg — ABNORMAL LOW (ref 26.6–33.0)
MCHC: 31.3 g/dL — ABNORMAL LOW (ref 31.5–35.7)
MCV: 66 fL — ABNORMAL LOW (ref 79–97)
Monocytes Absolute: 0.3 10*3/uL (ref 0.1–0.9)
Monocytes: 7 %
Neutrophils Absolute: 2.2 10*3/uL (ref 1.4–7.0)
Neutrophils: 47 %
Platelets: 378 10*3/uL (ref 150–450)
RBC: 4.82 x10E6/uL (ref 3.77–5.28)
RDW: 17 % — ABNORMAL HIGH (ref 11.7–15.4)
WBC: 4.7 10*3/uL (ref 3.4–10.8)

## 2021-02-22 LAB — IRON,TIBC AND FERRITIN PANEL
Ferritin: 14 ng/mL — ABNORMAL LOW (ref 15–150)
Iron Saturation: 8 % — CL (ref 15–55)
Iron: 28 ug/dL (ref 27–159)
Total Iron Binding Capacity: 372 ug/dL (ref 250–450)
UIBC: 344 ug/dL (ref 131–425)

## 2021-02-22 LAB — HEMOGLOBIN A1C
Est. average glucose Bld gHb Est-mCnc: 137 mg/dL
Hgb A1c MFr Bld: 6.4 % — ABNORMAL HIGH (ref 4.8–5.6)

## 2021-02-22 LAB — HCV AB W REFLEX TO QUANT PCR: HCV Ab: <0.1 s/co ratio (ref 0.0–0.9)

## 2021-02-22 LAB — HCV INTERPRETATION

## 2021-02-22 MED ORDER — METFORMIN HCL 500 MG PO TABS: 500.0000 mg | ORAL_TABLET | Freq: Every day | ORAL | 6 refills | Status: AC

## 2021-02-22 MED ORDER — IRON (FERROUS SULFATE) 325 (65 FE) MG PO TABS: 325.0000 mg | ORAL_TABLET | Freq: Two times a day (BID) | ORAL | 2 refills | Status: AC

## 2021-05-29 ENCOUNTER — Ambulatory Visit: Payer: 59 | Admitting: Family Medicine

## 2022-03-08 IMAGING — CT CT HEAD W/O CM
3 series · 15 of 47 positions shown, 18 images · non-contrast
Comparison: May 12, 2009 head CT; brain MRI April 01, 2013

CLINICAL DATA: Headache and dizziness

EXAM:
CT HEAD WITHOUT CONTRAST
TECHNIQUE: Contiguous axial images were obtained from the base of the skull
through the vertex without intravenous contrast.

[Series 4: head 5.0 h30s · axial · 0.44mm/px · z∈[-119,+11]mm · 9 of 32 slices shown, 12 images]
[im 3/32  brain]
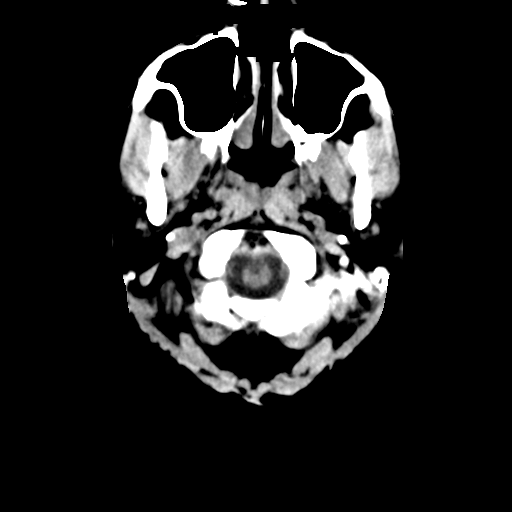
[im 3/32  bone]
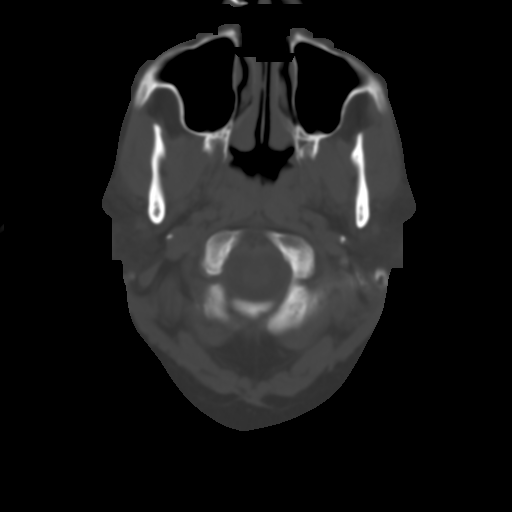
[im 6/32  brain]
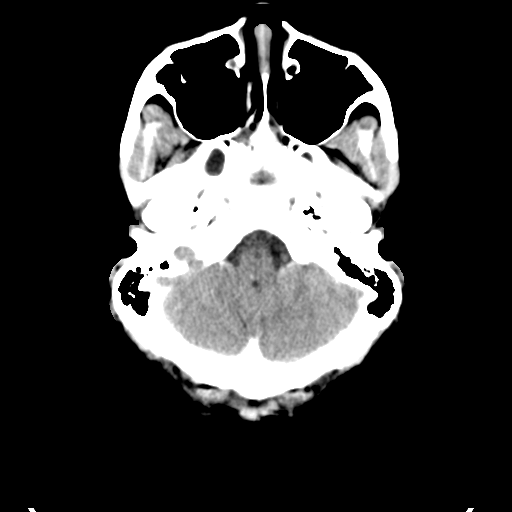
[im 9/32  brain]
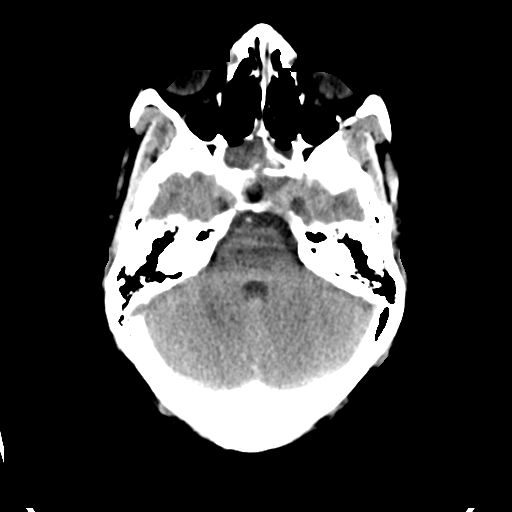
[im 12/32  brain]
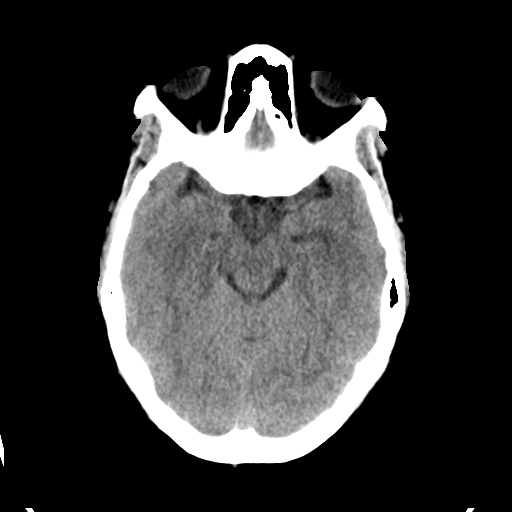
[im 17/32  brain]
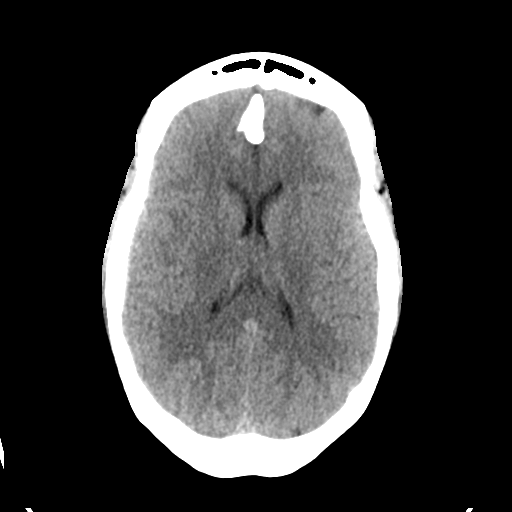
[im 17/32  bone]
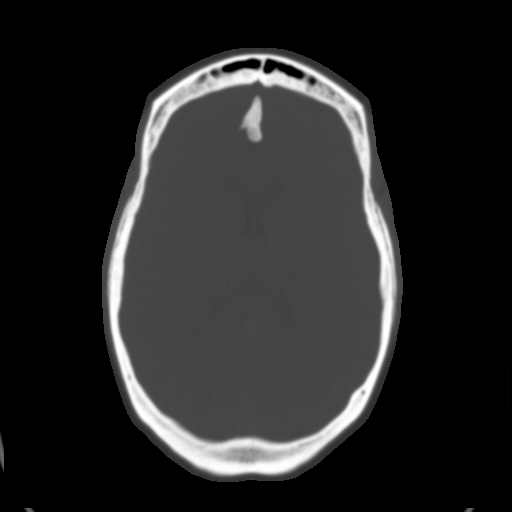
[im 20/32  brain]
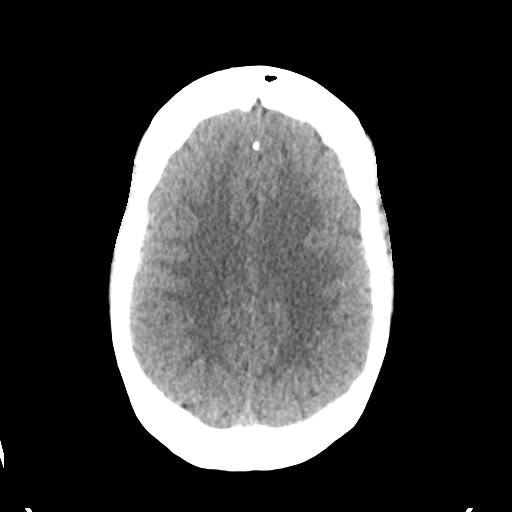
[im 23/32  brain]
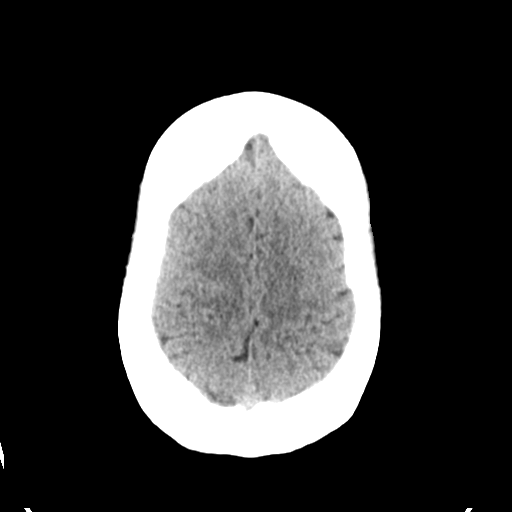
[im 26/32  brain]
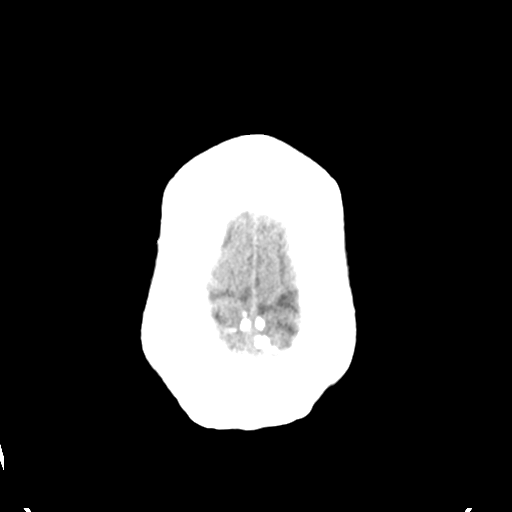
[im 29/32  brain]
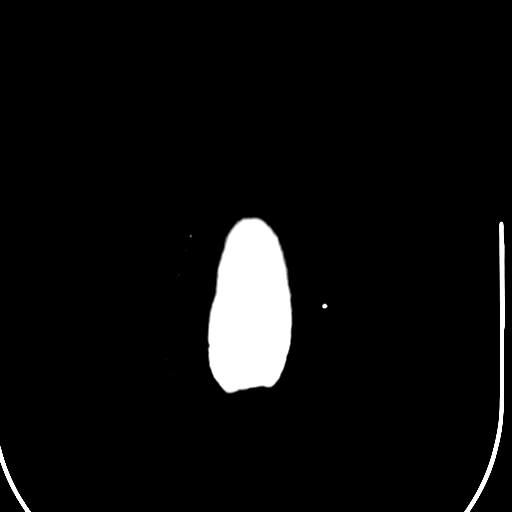
[im 29/32  bone]
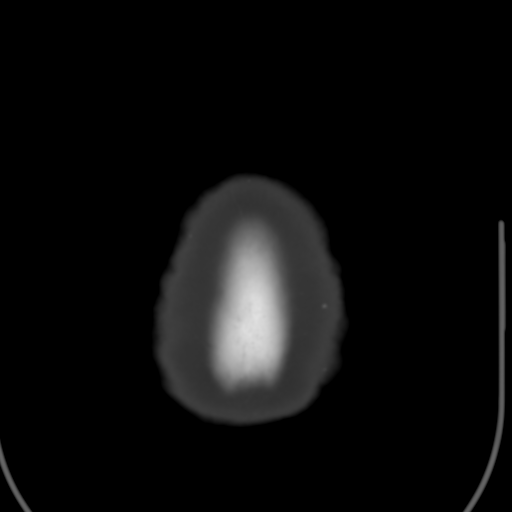

[Series 5: head 3.0 mpr cor · coronal · 0.30mm/px · 3 of 71 slices shown]
[im 24/71  brain]
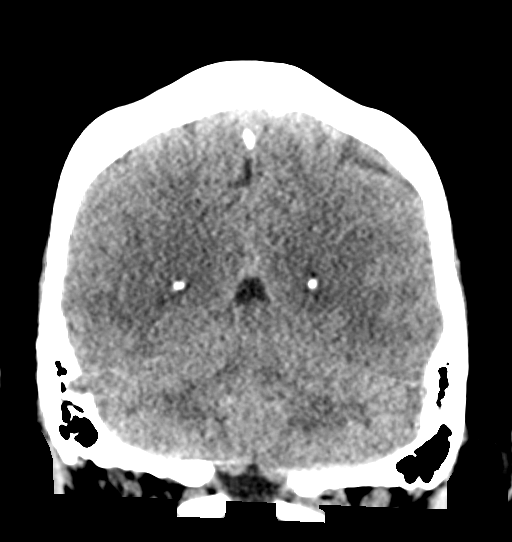
[im 32/71  brain]
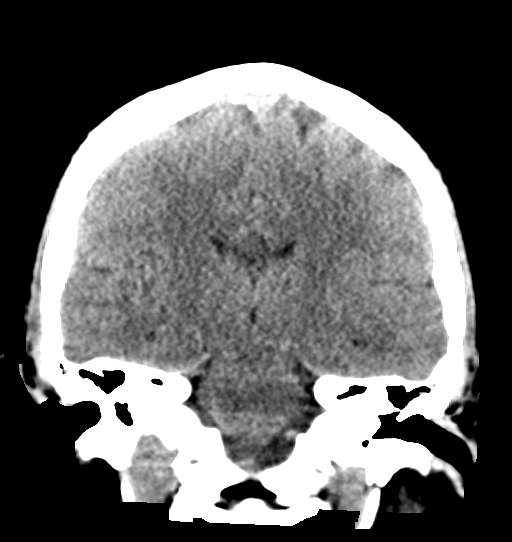
[im 39/71  brain]
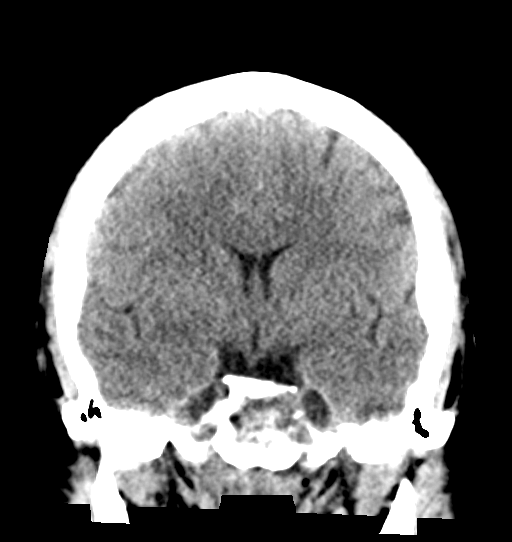

[Series 6: head 3.0 mpr sag · sagittal · 0.31mm/px · 3 of 52 slices shown]
[im 18/52  brain]
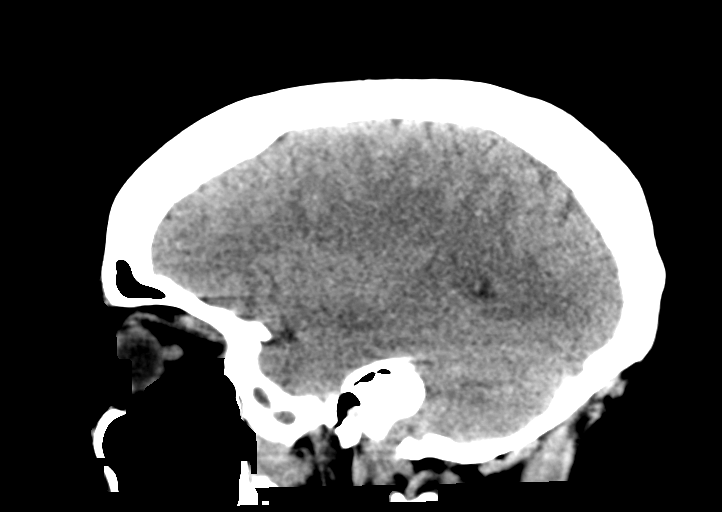
[im 26/52  brain]
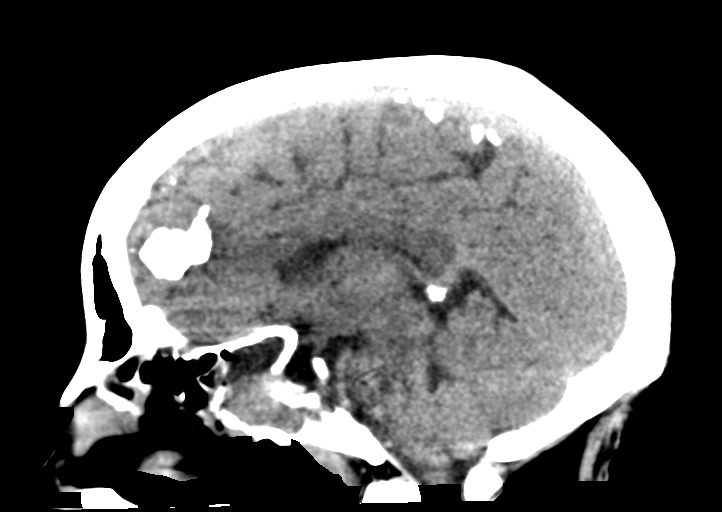
[im 35/52  brain]
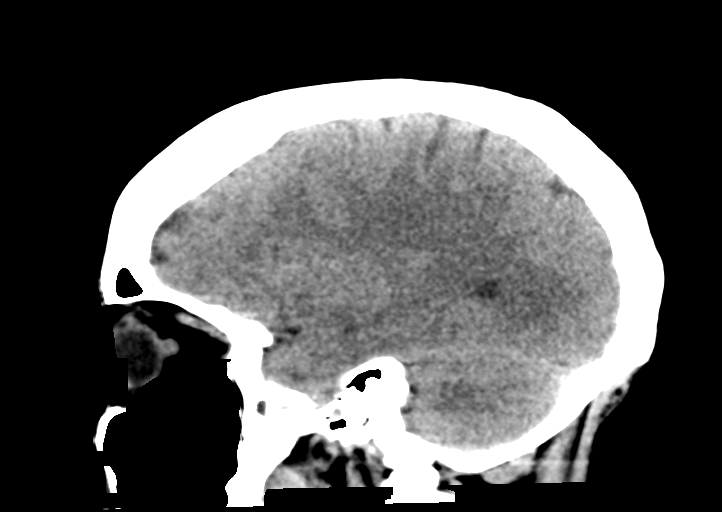

[15 of 47 positions shown; findings below may reference images not displayed]

FINDINGS: Brain: Ventricles and sulci are normal in size and configuration. In
the sellar region, there is soft tissue fullness with extensive bony
destruction of much of the sella and most of the clivus, a finding
present previously. Extension of this mass occupying the sphenoid
sinus regions with bony expansion and destruction is again noted,
similar to prior study. Elsewhere, brain parenchyma appears normal
without evident acute infarct. No new mass. No hemorrhage,
extra-axial fluid collection, or midline shift.

Vascular: No hyperdense vessels. No appreciable vascular
calcification.

Skull: Bony destruction in the sella and clivus region as noted as
well as bony destruction and expansion in sphenoid sinuses. Bony
calvarium otherwise appears intact.

Sinuses/Orbits: Opacification throughout the sphenoid sinus regions.
Other paranasal sinuses clear. Orbits appear symmetric bilaterally.

Other: Mastoid air cells clear.
IMPRESSION: Residua from pituitary region mass with bony destruction and
expansion in the peri sellar region with extension of mass into the
sphenoid sinus regions. Destruction of most of the clivus noted.

Elsewhere no intracranial mass or hemorrhage. No acute infarct. No
extra-axial fluid.

Given the extent of abnormality in the sella and adjacent regions,
correlation with MR pre and post contrast may well be advisable to
further assess.

## 2023-08-29 ENCOUNTER — Ambulatory Visit (INDEPENDENT_AMBULATORY_CARE_PROVIDER_SITE_OTHER): Admitting: Dermatology

## 2023-08-29 ENCOUNTER — Encounter: Payer: Self-pay | Admitting: Dermatology

## 2023-08-29 VITALS — BP 137/93 | HR 83

## 2023-08-29 DIAGNOSIS — L821 Other seborrheic keratosis: Secondary | ICD-10-CM

## 2023-08-29 DIAGNOSIS — B360 Pityriasis versicolor: Secondary | ICD-10-CM | POA: Diagnosis not present

## 2023-08-29 DIAGNOSIS — L304 Erythema intertrigo: Secondary | ICD-10-CM | POA: Diagnosis not present

## 2023-08-29 DIAGNOSIS — L813 Cafe au lait spots: Secondary | ICD-10-CM | POA: Diagnosis not present

## 2023-08-29 DIAGNOSIS — L918 Other hypertrophic disorders of the skin: Secondary | ICD-10-CM

## 2023-08-29 MED ORDER — KETOCONAZOLE 2 % EX SHAM: MEDICATED_SHAMPOO | CUTANEOUS | 0 refills | Status: AC

## 2023-08-29 MED ORDER — NYSTATIN 100000 UNIT/GM EX POWD: 1.0000 | Freq: Three times a day (TID) | CUTANEOUS | 0 refills | Status: AC

## 2023-08-29 NOTE — Progress Notes (Signed)
 New Patient Visit   Subjective  Kari Coffey is a 48 y.o. female who presents for the following: Spot of concern. Patient has been referred by her gynecologist. She thinks the areas of concern were the dark spots on her face, which she reports have never bothered her. She notes that she does have irritation under her breasts in the summer months due to heat and sweating.    The following portions of the chart were reviewed this encounter and updated as appropriate: medications, allergies, medical history  Review of Systems:  No other skin or systemic complaints except as noted in HPI or Assessment and Plan.  Objective  Well appearing patient in no apparent distress; mood and affect are within normal limits.  A focused examination was performed of the following areas: Face, abdomen, and back.  Relevant exam findings are noted in the Assessment and Plan.    Assessment & Plan   INTERTRIGO Exam: Erythematous macerated patches in body folds under breasts (right > left)  flared   Intertrigo is a chronic recurrent rash that occurs in skin fold areas that may be associated with friction; heat; moisture; yeast; fungus; and bacteria.  It is exacerbated by increased movement / activity; sweating; and higher atmospheric temperature.  Use of an absorbant powder such as Zeasorb AF powder or other OTC antifungal powder to the area daily can prevent rash recurrence. Other options to help keep the area dry include blow drying the area after bathing or using antiperspirant products such as Duradry sweat minimizing gel.  Treatment Plan: Nystatin Powder   Tinea Versicolor- flared- upper back  Use ketoconazole shampoo as body wash three times a week, especially in summer months   Tinea versicolor is a chronic recurrent skin rash causing discolored scaly spots most commonly seen on back, chest, and/or shoulders.  It is generally asymptomatic. The rash is due to overgrowth of a common type of  yeast present on everyone's skin and it is not contagious.  It tends to flare more in the summer due to increased sweating on trunk.  After rash is treated, the scaliness will resolve, but the discoloration will take longer to return to normal pigmentation. The periodic use of an OTC medicated soap/shampoo with zinc or selenium sulfide can be helpful to prevent yeast overgrowth and recurrence.   Dermatosis Papulosa Nigra  - Discussed cosmetic fee - Patient will consider  Skin Tags - Fleshy, skin-colored pedunculated papules - Benign appearing.  - Observe. - If desired, they can be removed with an in office procedure that is not covered by insurance. - Please call the clinic if you notice any new or changing lesions.  - Discussed using Dr. 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 She is aware that there is a $100 charge at the time of scheduling and a $100 charge at the time of the visit for the removal of 20 lesions.  CAFE AU LAIT SPOTS Right Upper Arm - Anterior benign TINEA VERSICOLOR Right Lower Back Related Medications ketoconazole (NIZORAL) 2 % shampoo Apply 1 application three times a week ERYTHEMA INTERTRIGO Right Inframammary Fold Related Medications nystatin (MYCOSTATIN/NYSTOP) powder Apply 1 Application topically 3 (three) times daily.  Return for DPN removal with Dr. Shavelle Runkel.  IBerwyn Lesches, Surg Tech III, am acting as scribe for  RUFUS CHRISTELLA HOLY, MD.   Documentation: I have reviewed the above documentation for accuracy and completeness, and I agree with the above.  RUFUS CHRISTELLA HOLY, MD

## 2023-08-29 NOTE — Patient Instructions (Addendum)
